# Patient Record
Sex: Male | Born: 1960
Health system: Southern US, Community
[De-identification: ages and names within clinical notes are randomized; demographics above are authoritative.]

## PROBLEM LIST (undated history)

## (undated) DIAGNOSIS — F411 Generalized anxiety disorder: Secondary | ICD-10-CM

## (undated) DIAGNOSIS — L738 Other specified follicular disorders: Secondary | ICD-10-CM

## (undated) HISTORY — DX: Generalized anxiety disorder: F41.1

## (undated) HISTORY — DX: Other specified follicular disorders: L73.8

---

## 2001-10-11 ENCOUNTER — Encounter: Payer: Self-pay | Admitting: Family Medicine

## 2001-10-11 ENCOUNTER — Encounter: Admission: RE | Admit: 2001-10-11 | Discharge: 2001-10-11 | Payer: Self-pay | Admitting: Family Medicine

## 2009-04-15 ENCOUNTER — Encounter: Payer: Self-pay | Admitting: Family Medicine

## 2010-10-12 ENCOUNTER — Ambulatory Visit: Payer: Self-pay | Admitting: Family Medicine

## 2010-10-12 DIAGNOSIS — L738 Other specified follicular disorders: Secondary | ICD-10-CM | POA: Insufficient documentation

## 2010-10-12 DIAGNOSIS — L678 Other hair color and hair shaft abnormalities: Secondary | ICD-10-CM

## 2010-10-12 DIAGNOSIS — F411 Generalized anxiety disorder: Secondary | ICD-10-CM | POA: Insufficient documentation

## 2010-10-12 HISTORY — DX: Generalized anxiety disorder: F41.1

## 2010-10-12 HISTORY — DX: Other specified follicular disorders: L73.8

## 2010-10-12 HISTORY — DX: Other hair color and hair shaft abnormalities: L67.8

## 2011-01-24 NOTE — Assessment & Plan Note (Signed)
Summary: to be est/njr   Vital Signs:  Patient profile:   50 year old male Height:      70.25 inches Weight:      197 pounds BMI:     28.17 Temp:     97.9 degrees F oral Pulse rate:   72 / minute Pulse rhythm:   regular Resp:     12 per minute BP sitting:   122 / 80  (left arm) Cuff size:   regular  Vitals Entered By: Sid Falcon LPN (October 12, 2010 11:27 AM)  Nutrition Counseling: Patient's BMI is greater than 25 and therefore counseled on weight management options.   History of Present Illness: Patient seen to establish care.  He has history of chronic nonspecific anxiety and takes low-dose Lexapro 20 mg one half tablet daily and has been on this for several years. No history of depression. No prior surgeries. No known drug allergies.  He describes recurrent follicular rash mostly trunk and buttock area as well as posterior arms and got some sort of topical steroid per dermatologist which did not help. Rash comes and goes. No clear exacerbating features.  Family history unrevealing.  Social history is that he works as a Designer, industrial/product. Smokes less than one packs per day. No alcohol use.  Preventive Screening-Counseling & Management  Alcohol-Tobacco     Smoking Status: current     Packs/Day: 0.75     Year Started: 1988  Caffeine-Diet-Exercise     Does Patient Exercise: yes  Allergies (verified): No Known Drug Allergies  Past History:  Family History: Last updated: 10/12/2010 Family History of Alcoholism/Addiction, grandparent  Social History: Last updated: 10/12/2010 Occupation:  Development worker, community Married Current Smoker Alcohol use-yes Regular exercise-yes  Risk Factors: Exercise: yes (10/12/2010)  Risk Factors: Smoking Status: current (10/12/2010) Packs/Day: 0.75 (10/12/2010)  Past Medical History: Anxiety Recurrent folliculitis PMH-FH-SH reviewed for relevance  Family History: Family History of Alcoholism/Addiction,  grandparent  Social History: Occupation:  Development worker, community Married Current Smoker Alcohol use-yes Regular exercise-yes Smoking Status:  current Packs/Day:  0.75 Occupation:  employed Does Patient Exercise:  yes  Review of Systems  The patient denies anorexia, fever, weight loss, weight gain, vision loss, decreased hearing, hoarseness, chest pain, syncope, dyspnea on exertion, peripheral edema, prolonged cough, headaches, hemoptysis, abdominal pain, melena, hematochezia, severe indigestion/heartburn, hematuria, incontinence, genital sores, muscle weakness, suspicious skin lesions, transient blindness, difficulty walking, depression, unusual weight change, abnormal bleeding, enlarged lymph nodes, and testicular masses.    Physical Exam  General:  Well-developed,well-nourished,in no acute distress; alert,appropriate and cooperative throughout examination Head:  Normocephalic and atraumatic without obvious abnormalities. No apparent alopecia or balding. Ears:  External ear exam shows no significant lesions or deformities.  Otoscopic examination reveals clear canals, tympanic membranes are intact bilaterally without bulging, retraction, inflammation or discharge. Hearing is grossly normal bilaterally. Mouth:  Oral mucosa and oropharynx without lesions or exudates.  Teeth in good repair. Neck:  No deformities, masses, or tenderness noted. Lungs:  Normal respiratory effort, chest expands symmetrically. Lungs are clear to auscultation, no crackles or wheezes. Heart:  Normal rate and regular rhythm. S1 and S2 normal without gallop, murmur, click, rub or other extra sounds. Skin:  patient has nonspecific rash mostly lower trunk area lower back with some nonspecific erythematous papules on couple of whitish to yellowish center. Psych:  normally interactive, good eye contact, not anxious appearing, and not depressed appearing.     Impression & Recommendations:  Problem # 1:  ANXIETY STATE,  UNSPECIFIED (ICD-300.00)  His updated medication list for this problem includes:    Lexapro 20 Mg Tabs (Escitalopram oxalate) ..... Once daily  Problem # 2:  FOLLICULITIS (ICD-704.8) trial of doxycycline 100 mg by mouth two times a day for 10 days  Complete Medication List: 1)  Lexapro 20 Mg Tabs (Escitalopram oxalate) .... Once daily 2)  Doxycycline Hyclate 100 Mg Caps (Doxycycline hyclate) .... One by mouth two times a day for 10 days  Other Orders: Admin 1st Vaccine (69678) Flu Vaccine 75yrs + (93810)  Patient Instructions: 1)  Consider a complete physical exam at some point within the next year Prescriptions: DOXYCYCLINE HYCLATE 100 MG CAPS (DOXYCYCLINE HYCLATE) one by mouth two times a day for 10 days  #20 x 1   Entered and Authorized by:   Evelena Peat MD   Signed by:   Evelena Peat MD on 10/12/2010   Method used:   Electronically to        [id number] (retail)       [address]       [place], [place]  [address]       Ph: [phone] or [phone]       Fax: [phone]   RxID:   [id number]    Orders Added: 1)  Admin 1st Vaccine [90471] 2)  Flu Vaccine 62yrs + [33825] 3)  New Patient Level III [05397]   Flu Vaccine Consent Questions     Do you have a history of severe allergic reactions to this vaccine? no    Any prior history of allergic reactions to egg and/or gelatin? no    Do you have a sensitivity to the preservative Thimersol? no    Do you have a past history of Guillan-Barre Syndrome? no    Do you currently have an acute febrile illness? no    Have you ever had a severe reaction to latex? no    Vaccine information given and explained to patient? yes    Are you currently pregnant? no    Lot Number:[id number]   Exp Date:06/24/2011   Site Given  Left Deltoid IM         .lbflu

## 2011-01-26 NOTE — Letter (Signed)
Summary: MD [facility] Annual Physical-Dr. Bradd Canary  MD [facility] Annual Physical-Dr. Bradd Canary   Imported By: Maryln Gottron 12/08/2010 14:58:47  _____________________________________________________________________  External Attachment:    Type:   Image     Comment:   External Document

## 2011-03-03 ENCOUNTER — Telehealth: Payer: Self-pay | Admitting: Family Medicine

## 2011-03-03 DIAGNOSIS — F329 Major depressive disorder, single episode, unspecified: Secondary | ICD-10-CM

## 2011-03-03 DIAGNOSIS — F32A Depression, unspecified: Secondary | ICD-10-CM

## 2011-03-03 MED ORDER — ESCITALOPRAM OXALATE 20 MG PO TABS: 20.0000 mg | ORAL_TABLET | Freq: Every day | ORAL | Status: DC

## 2011-03-03 NOTE — Telephone Encounter (Signed)
Pt needs a refill on med: Lexapro 20mg ... [facility]

## 2011-03-03 NOTE — Telephone Encounter (Signed)
Rx sent to pt pharmacy 

## 2011-03-15 ENCOUNTER — Other Ambulatory Visit (INDEPENDENT_AMBULATORY_CARE_PROVIDER_SITE_OTHER): Payer: BC Managed Care – PPO | Admitting: Family Medicine

## 2011-03-15 DIAGNOSIS — E785 Hyperlipidemia, unspecified: Secondary | ICD-10-CM

## 2011-03-15 DIAGNOSIS — Z0000 Encounter for general adult medical examination without abnormal findings: Secondary | ICD-10-CM

## 2011-03-15 LAB — POCT URINALYSIS DIPSTICK
Bilirubin, UA: NEGATIVE
Glucose, UA: NEGATIVE
Ketones, UA: NEGATIVE
Leukocytes, UA: NEGATIVE
Nitrite, UA: NEGATIVE
Protein, UA: NEGATIVE
Spec Grav, UA: 1.025
Urobilinogen, UA: 0.2
pH, UA: 5

## 2011-03-15 LAB — CBC WITH DIFFERENTIAL/PLATELET
Basophils Absolute: 0 10*3/uL (ref 0.0–0.1)
Basophils Relative: 0.3 % (ref 0.0–3.0)
Eosinophils Absolute: 0.3 10*3/uL (ref 0.0–0.7)
Eosinophils Relative: 3.8 % (ref 0.0–5.0)
HCT: 42.4 % (ref 39.0–52.0)
Hemoglobin: 14.4 g/dL (ref 13.0–17.0)
Lymphocytes Relative: 29 % (ref 12.0–46.0)
Lymphs Abs: 2.5 10*3/uL (ref 0.7–4.0)
MCHC: 34 g/dL (ref 30.0–36.0)
MCV: 91.7 fl (ref 78.0–100.0)
Monocytes Absolute: 0.6 10*3/uL (ref 0.1–1.0)
Monocytes Relative: 7.3 % (ref 3.0–12.0)
Neutro Abs: 5.1 10*3/uL (ref 1.4–7.7)
Neutrophils Relative %: 59.6 % (ref 43.0–77.0)
Platelets: 223 10*3/uL (ref 150.0–400.0)
RBC: 4.63 Mil/uL (ref 4.22–5.81)
RDW: 12.6 % (ref 11.5–14.6)
WBC: 8.6 10*3/uL (ref 4.5–10.5)

## 2011-03-15 LAB — BASIC METABOLIC PANEL
BUN: 20 mg/dL (ref 6–23)
CO2: 28 mEq/L (ref 19–32)
Calcium: 8.9 mg/dL (ref 8.4–10.5)
Chloride: 103 mEq/L (ref 96–112)
Creatinine, Ser: 0.9 mg/dL (ref 0.4–1.5)
GFR: 94.46 mL/min (ref >60.00)
Glucose, Bld: 90 mg/dL (ref 70–99)
Potassium: 3.9 mEq/L (ref 3.5–5.1)
Sodium: 137 mEq/L (ref 135–145)

## 2011-03-15 LAB — HEPATIC FUNCTION PANEL
ALT: 21 U/L (ref 0–53)
AST: 22 U/L (ref 0–37)
Albumin: 4.4 g/dL (ref 3.5–5.2)
Alkaline Phosphatase: 49 U/L (ref 39–117)
Bilirubin, Direct: 0.1 mg/dL (ref 0.0–0.3)
Total Bilirubin: 0.4 mg/dL (ref 0.3–1.2)
Total Protein: 6.3 g/dL (ref 6.0–8.3)

## 2011-03-15 LAB — LIPID PANEL
Cholesterol: 138 mg/dL (ref 0–200)
HDL: 24.2 mg/dL — ABNORMAL LOW (ref >39.00)
Total CHOL/HDL Ratio: 6
Triglycerides: 250 mg/dL — ABNORMAL HIGH (ref 0.0–149.0)
VLDL: 50 mg/dL — ABNORMAL HIGH (ref 0.0–40.0)

## 2011-03-15 LAB — LDL CHOLESTEROL, DIRECT: Direct LDL: 65.6 mg/dL

## 2011-03-15 LAB — TSH: TSH: 1.83 u[IU]/mL (ref 0.35–5.50)

## 2011-03-21 ENCOUNTER — Encounter: Payer: Self-pay | Admitting: Family Medicine

## 2011-03-22 ENCOUNTER — Encounter: Payer: Self-pay | Admitting: Family Medicine

## 2011-03-22 ENCOUNTER — Ambulatory Visit (INDEPENDENT_AMBULATORY_CARE_PROVIDER_SITE_OTHER): Payer: BC Managed Care – PPO | Admitting: Family Medicine

## 2011-03-22 DIAGNOSIS — R319 Hematuria, unspecified: Secondary | ICD-10-CM

## 2011-03-22 LAB — POCT URINALYSIS DIPSTICK
Protein, UA: NEGATIVE
Spec Grav, UA: 1.01
Urobilinogen, UA: 0.2

## 2011-03-22 NOTE — Patient Instructions (Signed)
Continue regular exercise. Continue efforts to quit smoking. Consider omega-3 supplement 2-3 g per day

## 2011-03-22 NOTE — Progress Notes (Signed)
  Subjective:    Patient ID: [name] [name], male    DOB: May 30, 1961, 50 y.o.   MRN: [id number]  HPI Patient here for complete physical. He is exercising consistently. Trying to quit smoking. Last tetanus date not confirmed at this time-but he thinks less than 5 years ago. He takes Lexapro for some chronic anxiety but no other medications. Past medical history, social history, and family history reviewed. No family history of cancer, hypertension, or CAD.  Sister with type 2 diabetes   Review of Systems  Constitutional: Negative for fever, activity change, appetite change and fatigue.  HENT: Negative for ear pain, congestion and trouble swallowing.   Eyes: Negative for pain and visual disturbance.  Respiratory: Negative for cough, shortness of breath and wheezing.   Cardiovascular: Negative for chest pain and palpitations.  Gastrointestinal: Negative for nausea, vomiting, abdominal pain, diarrhea, constipation, blood in stool, abdominal distention and rectal pain.  Genitourinary: Negative for dysuria, hematuria and testicular pain.  Musculoskeletal: Negative for joint swelling and arthralgias.  Skin: Positive for rash (continued follicular rash off and on buttocks.).  Neurological: Negative for dizziness, syncope and headaches.  Hematological: Negative for adenopathy.  Psychiatric/Behavioral: Negative for confusion and dysphoric mood.       Objective:   Physical Exam  Constitutional: He is oriented to person, place, and time. He appears well-developed and well-nourished. No distress.  HENT:  Head: Normocephalic and atraumatic.  Right Ear: External ear normal.  Left Ear: External ear normal.  Mouth/Throat: Oropharynx is clear and moist.  Eyes: Conjunctivae and EOM are normal. Pupils are equal, round, and reactive to light.  Neck: Normal range of motion. Neck supple. No thyromegaly present.  Cardiovascular: Normal rate, regular rhythm and normal heart sounds.   No murmur  heard. Pulmonary/Chest: No respiratory distress. He has no wheezes. He has no rales.  Abdominal: Soft. Bowel sounds are normal. He exhibits no distension and no mass. There is no tenderness. There is no rebound and no guarding.  Genitourinary: Rectum normal and prostate normal.  Musculoskeletal: He exhibits no edema.  Lymphadenopathy:    He has no cervical adenopathy.  Neurological: He is alert and oriented to person, place, and time. He displays normal reflexes. No cranial nerve deficit.  Skin: No rash noted.  Psychiatric: He has a normal mood and affect.          Assessment & Plan:  #1 health maintenance issues addressed. Discussed smoking cessation. Confirm date of last tetanus. Continue regular exercise. Labs reviewed and significant for low HDL high triglyceride. Consider omega-3 supplement. #2 hematuria noted on urine dipstick. Repeat urine today and if still present needs further evaluation #3 dyslipidemia. Has high triglycerides and low HDL

## 2011-09-06 ENCOUNTER — Other Ambulatory Visit: Payer: Self-pay | Admitting: *Deleted

## 2011-09-06 MED ORDER — ESCITALOPRAM OXALATE 20 MG PO TABS: ORAL_TABLET | ORAL | Status: DC

## 2012-04-16 ENCOUNTER — Other Ambulatory Visit: Payer: Self-pay | Admitting: Family Medicine

## 2012-09-13 ENCOUNTER — Encounter: Payer: Self-pay | Admitting: Family Medicine

## 2012-09-13 ENCOUNTER — Ambulatory Visit (INDEPENDENT_AMBULATORY_CARE_PROVIDER_SITE_OTHER): Payer: BC Managed Care – PPO | Admitting: Family Medicine

## 2012-09-13 VITALS — BP 118/70 | Temp 97.8°F | Wt 177.0 lb

## 2012-09-13 DIAGNOSIS — R41840 Attention and concentration deficit: Secondary | ICD-10-CM

## 2012-09-13 MED ORDER — ESCITALOPRAM OXALATE 20 MG PO TABS: 20.0000 mg | ORAL_TABLET | Freq: Every day | ORAL | Status: DC

## 2012-09-13 NOTE — Patient Instructions (Addendum)
Increase Lexapro to 20 mg daily and be in touch in 2-3 weeks if no further improvement.

## 2012-09-13 NOTE — Progress Notes (Signed)
  Subjective:    Patient ID: [name] [name], male    DOB: 1961/10/21, 51 y.o.   MRN: [id number]  HPI  Patient is seen with concerns over difficulty focusing. 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 Recently had difficulty staying focused and completing tasks. No history of ADD diagnosis. Recently took online  ADD screen which showed moderate ADD likely. Recent has been more prone to careless mistakes. Denies any depressive symptoms. Long history of anxiety treated with Lexapro currently 10 mg daily. Has somewhat more anxious recently. No fatigue issues.. Job is very stressful but no new stressors. His difficulty focusing seems to occur in more than one environment.  He recalls having difficulty focusing in school from early age but did fairly well with grades.   Review of Systems  Constitutional: Negative for appetite change and unexpected weight change.  Psychiatric/Behavioral: Negative for dysphoric mood and agitation. The patient is nervous/anxious.        Objective:   Physical Exam  Constitutional: He is oriented to person, place, and time. He appears well-developed and well-nourished.  Cardiovascular: Normal rate and regular rhythm.   Pulmonary/Chest: Effort normal and breath sounds normal. No respiratory distress. He has no wheezes. He has no rales.  Musculoskeletal: He exhibits no edema.  Neurological: He is alert and oriented to person, place, and time. No cranial nerve deficit.  Psychiatric: He has a normal mood and affect. His behavior is normal.          Assessment & Plan:  Difficulty focusing. Question of anxiety related versus possible ADD. Trial of increasing Lexapro to 20 mg daily. Touch base 2-3 weeks. May consider trial of stimulant medication if not improving

## 2013-04-30 ENCOUNTER — Telehealth: Payer: Self-pay | Admitting: Family Medicine

## 2013-04-30 NOTE — Telephone Encounter (Signed)
Patient Information:  Caller Name: Eliazar  Phone: [phone]  Patient: [name], [name]  Gender: Male  DOB: 12-10-1961  Age: 52 Years  PCP: Evelena Peat (Family Practice)  Office Follow Up:  Does the office need to follow up with this patient?: No  Instructions For The Office: N/A  RN Note:  informed Chang that an OV is usually needed to get an antibiotic; advised using an UC since Noelle is out of state and can't come in the office to be seen  Symptoms  Reason For Call & Symptoms: says sxs started with a head cold and seems to have moved into his chest; achy; coughing up greenish phlegm; feels like he has a fever; is in [place] on business at this time; congested  Reviewed Health History In EMR: Yes  Reviewed Medications In EMR: Yes  Reviewed Allergies In EMR: Yes  Reviewed Surgeries / Procedures: Yes  Date of Onset of Symptoms: 04/27/2013  Treatments Tried: Alka Seltzer daytime cold  Treatments Tried Worked: Yes  Guideline(s) Used:  Cough  Colds  Disposition Per Guideline:   Go to Office Now  Reason For Disposition Reached:   Wheezing is present  Advice Given:  N/A  RN Overrode Recommendation:  Go To [facility]  pt is in [place]

## 2013-10-22 ENCOUNTER — Other Ambulatory Visit: Payer: Self-pay | Admitting: Family Medicine

## 2014-01-07 ENCOUNTER — Telehealth: Payer: Self-pay | Admitting: Family Medicine

## 2014-01-07 MED ORDER — ESCITALOPRAM OXALATE 20 MG PO TABS: ORAL_TABLET | ORAL | Status: DC

## 2014-01-07 NOTE — Telephone Encounter (Signed)
Pt request refill of escitalopram (LEXAPRO) 20 MG tablet 90 day [facility] [address]

## 2014-01-07 NOTE — Telephone Encounter (Signed)
RX sent to pharmacy. Pt needs office visit

## 2014-02-16 ENCOUNTER — Encounter: Payer: Self-pay | Admitting: Family Medicine

## 2014-02-16 ENCOUNTER — Ambulatory Visit (INDEPENDENT_AMBULATORY_CARE_PROVIDER_SITE_OTHER): Payer: BC Managed Care – PPO | Admitting: Family Medicine

## 2014-02-16 VITALS — BP 120/76 | HR 70 | Temp 98.2°F | Wt 196.0 lb

## 2014-02-16 DIAGNOSIS — L03211 Cellulitis of face: Secondary | ICD-10-CM

## 2014-02-16 DIAGNOSIS — L0201 Cutaneous abscess of face: Secondary | ICD-10-CM

## 2014-02-16 MED ORDER — DOXYCYCLINE HYCLATE 100 MG PO TABS: 100.0000 mg | ORAL_TABLET | Freq: Two times a day (BID) | ORAL | Status: DC

## 2014-02-16 NOTE — Progress Notes (Signed)
Pre visit review using our clinic review tool, if applicable. No additional management support is needed unless otherwise documented below in the visit note. 

## 2014-02-16 NOTE — Patient Instructions (Signed)
Use warm compresses several times daily Be in touch for any fever or progressive facial edema

## 2014-02-16 NOTE — Progress Notes (Signed)
   Subjective:    Patient ID: [name] [name], male    DOB: 11/08/1961, 53 y.o.   MRN: [id number]  HPI Right facial pain and swelling for the past few days. He just returned from [place]. He thought there may have been some sort of bite. Erythematous type pimple which has  progressively swollen. No pustular center. No known history of MRSA. No fever or chills. He tried topical Neosporin without improvement  Past Medical History  Diagnosis Date  . Anxiety state, unspecified 10/12/2010  . FOLLICULITIS 10/12/2010   No past surgical history on file.  reports that he has been smoking Cigarettes.  He has a 7.5 pack-year smoking history. He does not have any smokeless tobacco history on file. His alcohol and drug histories are not on file. family history includes Alcohol abuse in his maternal grandfather; Diabetes in his sister. No Known Allergies    Review of Systems  Constitutional: Negative for fever and chills.       Objective:   Physical Exam  Constitutional: He appears well-developed and well-nourished.  Cardiovascular: Normal rate and regular rhythm.   Pulmonary/Chest: Effort normal and breath sounds normal. No respiratory distress. He has no wheezes. He has no rales.  Skin:  Patient has small area of induration approximately one 1 half centimeter right cheek region. He has surrounding area of erythema which is about 1-1/2 cm diameter. No pustules. No fluctuance. Minimally tender.          Assessment & Plan:  Early cellulitis right face. Start doxycycline 100 mg twice daily for 10 days. Warm compresses several times daily.  Followup promptly for fever or worsening edema

## 2014-02-17 ENCOUNTER — Telehealth: Payer: Self-pay | Admitting: Family Medicine

## 2014-02-17 NOTE — Telephone Encounter (Signed)
Relevant patient education mailed to patient.  

## 2014-03-31 ENCOUNTER — Telehealth: Payer: Self-pay | Admitting: Family Medicine

## 2014-03-31 MED ORDER — ESCITALOPRAM OXALATE 20 MG PO TABS: ORAL_TABLET | ORAL | Status: DC

## 2014-03-31 NOTE — Telephone Encounter (Signed)
Pt request 90 day escitalopram (LEXAPRO) 20 MG tablet Pt will be traveling out of town for business and will be gone until 05/02/14/  [facility] [address]

## 2014-03-31 NOTE — Telephone Encounter (Signed)
Rx sent to pharmacy   

## 2014-06-26 ENCOUNTER — Other Ambulatory Visit: Payer: Self-pay | Admitting: Family Medicine

## 2014-08-19 ENCOUNTER — Encounter: Payer: Self-pay | Admitting: Family Medicine

## 2014-08-19 ENCOUNTER — Ambulatory Visit (INDEPENDENT_AMBULATORY_CARE_PROVIDER_SITE_OTHER): Payer: BC Managed Care – PPO | Admitting: Family Medicine

## 2014-08-19 VITALS — BP 119/79 | HR 60 | Temp 97.4°F | Wt 196.0 lb

## 2014-08-19 DIAGNOSIS — M25551 Pain in right hip: Secondary | ICD-10-CM

## 2014-08-19 DIAGNOSIS — M25559 Pain in unspecified hip: Secondary | ICD-10-CM

## 2014-08-19 NOTE — Progress Notes (Signed)
Pre visit review using our clinic review tool, if applicable. No additional management support is needed unless otherwise documented below in the visit note. 
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
## 2014-08-21 ENCOUNTER — Ambulatory Visit: Payer: BC Managed Care – PPO | Admitting: Family Medicine

## 2014-08-27 ENCOUNTER — Encounter: Payer: Self-pay | Admitting: Family Medicine

## 2014-08-27 ENCOUNTER — Ambulatory Visit (INDEPENDENT_AMBULATORY_CARE_PROVIDER_SITE_OTHER): Payer: BC Managed Care – PPO | Admitting: Family Medicine

## 2014-08-27 ENCOUNTER — Other Ambulatory Visit (INDEPENDENT_AMBULATORY_CARE_PROVIDER_SITE_OTHER): Payer: BC Managed Care – PPO

## 2014-08-27 VITALS — BP 112/74 | HR 58 | Ht 72.0 in | Wt 196.0 lb

## 2014-08-27 DIAGNOSIS — M7061 Trochanteric bursitis, right hip: Secondary | ICD-10-CM | POA: Insufficient documentation

## 2014-08-27 DIAGNOSIS — M25551 Pain in right hip: Secondary | ICD-10-CM

## 2014-08-27 DIAGNOSIS — M76899 Other specified enthesopathies of unspecified lower limb, excluding foot: Secondary | ICD-10-CM

## 2014-08-27 DIAGNOSIS — M25559 Pain in unspecified hip: Secondary | ICD-10-CM
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
## 2014-08-27 NOTE — Assessment & Plan Note (Signed)
Patient did have a gluteal muscle tendon injury as well as trochanter bursitis. Patient did respond fairly well to injection and was given hip abductor exercises that are going to be beneficial. We discussed range of motion exercises as well as icing protocol. Patient is going to time and was given a prescription for topical anti-inflammatories. 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 Patient will try this and come back and see me again in 3-4 weeks or when he is back in town for further evaluation and treatment.

## 2014-08-27 NOTE — Patient Instructions (Signed)
Good to see you Ice 20 minutes 2 times daily. Usually after activity and before bed. Exercises 3 times a week.  Exercises on wall.  Heel and butt touching.  Raise leg 6 inches and hold 2 seconds.  Down slow for count of 4 seconds.  1 set of 30 reps daily on both sides.  Continue the naproxen.  Consider talking to your chiropractor about the SI joint.  Capsacin topically to the back  Come back again in 3-4 weeks. When you can.

## 2014-10-09 ENCOUNTER — Other Ambulatory Visit: Payer: Self-pay

## 2014-12-04 ENCOUNTER — Other Ambulatory Visit (INDEPENDENT_AMBULATORY_CARE_PROVIDER_SITE_OTHER): Payer: BC Managed Care – PPO

## 2014-12-04 ENCOUNTER — Ambulatory Visit (INDEPENDENT_AMBULATORY_CARE_PROVIDER_SITE_OTHER)
Admission: RE | Admit: 2014-12-04 | Discharge: 2014-12-04 | Disposition: A | Discharge status: Home / Self Care | Payer: BC Managed Care – PPO | Source: Ambulatory Visit | Attending: Family Medicine | Admitting: Family Medicine

## 2014-12-04 ENCOUNTER — Ambulatory Visit (INDEPENDENT_AMBULATORY_CARE_PROVIDER_SITE_OTHER): Payer: BC Managed Care – PPO | Admitting: Family Medicine

## 2014-12-04 ENCOUNTER — Encounter: Payer: Self-pay | Admitting: Family Medicine

## 2014-12-04 VITALS — BP 92/64 | HR 72 | Ht 72.0 in | Wt 199.0 lb

## 2014-12-04 DIAGNOSIS — M25551 Pain in right hip: Secondary | ICD-10-CM

## 2014-12-04 DIAGNOSIS — M7061 Trochanteric bursitis, right hip: Secondary | ICD-10-CM
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
## 2014-12-04 NOTE — Patient Instructions (Addendum)
Good to see you.   Happy holidays! Exercises 3 times a week.  We injected your hip again.  Wear good shoes and focus on the exercises Exercises on wall.  Heel and butt touching.  Raise leg 6 inches and hold 2 seconds.  Down slow for count of 4 seconds.  1 set of 30 reps daily on both sides.  Vitamin D 2000 IU daily See me again 4 weeks.
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
## 2015-01-03 ENCOUNTER — Other Ambulatory Visit: Payer: Self-pay | Admitting: Family Medicine

## 2015-02-08 ENCOUNTER — Other Ambulatory Visit (INDEPENDENT_AMBULATORY_CARE_PROVIDER_SITE_OTHER): Payer: BLUE CROSS/BLUE SHIELD

## 2015-02-08 ENCOUNTER — Ambulatory Visit (INDEPENDENT_AMBULATORY_CARE_PROVIDER_SITE_OTHER): Payer: BLUE CROSS/BLUE SHIELD | Admitting: Family Medicine

## 2015-02-08 ENCOUNTER — Encounter: Payer: Self-pay | Admitting: Family Medicine

## 2015-02-08 VITALS — BP 108/68 | HR 75 | Ht 72.0 in | Wt 193.0 lb

## 2015-02-08 DIAGNOSIS — M25551 Pain in right hip: Secondary | ICD-10-CM | POA: Diagnosis not present

## 2015-02-08 DIAGNOSIS — M7061 Trochanteric bursitis, right hip: Secondary | ICD-10-CM

## 2015-02-08 NOTE — Assessment & Plan Note (Signed)
Patient is improving overall but slowly. patient is doing more the home exercises and patient was given topical anti-inflammatories. We discussed to avoid significant pressure in this area. Patient will try topical anti-inflammatories, patient was given more progressive home exercises with strengthening. Patient and will come back and see me again in 3-4 weeks for further evaluation and treatment. Patient may be going out of town for greater amount of time. Then patient will come back on an as-needed basis.

## 2015-02-08 NOTE — Patient Instructions (Addendum)
Good to see you again.  You are doing great [place] twice daily.  Continue the exercises

## 2015-02-08 NOTE — Progress Notes (Signed)
[name] [address] [place], [place] [address] Phone: [phone] Subjective:     CC: Lateral hip pain follow-up  BJY:NWGNFAOZHY [name] [name] is a 54 y.o. male coming in with complaint of lateral hip pain. Patient was seen 3 months ago and was given an injection for greater trochanteric bursitis. Patient had this done 2 months ago. Patient states he's been doing significantly better. Patient has been doing a lot more of home exercises. Patient states that it is starting have some mild dull aching throbbing pain again on the lateral aspect of the hip. Patient states it is significantly better than where the pin but patient is going out of town for multiple months again is wondering if another injection is possible. We discussed icing regimen. She has not been doing a regularly. Patient though states as long as he doesn't exercises regularly he does well.   Past medical history, social, surgical and family history all reviewed in electronic medical record.   Review of Systems: No headache, visual changes, nausea, vomiting, diarrhea, constipation, dizziness, abdominal pain, skin rash, fevers, chills, night sweats, weight loss, swollen lymph nodes, body aches, joint swelling, muscle aches, chest pain, shortness of breath, mood changes.   Objective Blood pressure 108/68, pulse 75, height 6' (1.829 m), weight 193 lb (87.544 kg), SpO2 97 %.  General: No apparent distress alert and oriented x3 mood and affect normal, dressed appropriately.  HEENT: Pupils equal, extraocular movements intact  Respiratory: Patient's speak in full sentences and does not appear short of breath  Cardiovascular: No lower extremity edema, non tender, no erythema  Skin: Warm dry intact with no signs of infection or rash on extremities or on axial skeleton.  Abdomen: Soft nontender  Neuro: Cranial nerves II through XII are intact, neurovascularly intact in all extremities with 2+ DTRs and 2+  pulses.  Lymph: No lymphadenopathy of posterior or anterior cervical chain or axillae bilaterally.  Gait normal with good balance and coordination.  MSK:  Non tender with full range of motion and good stability and symmetric strength and tone of shoulders, elbows, wrist,  knee and ankles bilaterally.  Back Exam:  Inspection: Unremarkable  Motion: Flexion 45 deg, Extension 45 deg, Side Bending to 45 deg bilaterally,  Rotation to 45 deg bilaterally  SLR laying: Negative  XSLR laying: Negative  Palpable tenderness: Tender to palpation over the greater trochanteric bursa as well as the gluteal muscle and piriformis muscle FABER: Positive right Sensory change: Gross sensation intact to all lumbar and sacral dermatomes.  Reflexes: 2+ at both patellar tendons, 2+ at achilles tendons, Babinski's downgoing.  Strength at foot  Plantar-flexion: 5/5 Dorsi-flexion: 5/5 Eversion: 5/5 Inversion: 5/5  Leg strength  Quad: 5/5 Hamstring: 5/5 Hip flexor: 5/5 Hip abductors: 4/5 which is worse than previous exam Gait unremarkable. MSK US performed of: Right This study was ordered, performed, and interpreted by Terrilee Files D.O.  Hip: Trochanteric bursa with significant hypoechoic changes and swelling as well as significant hypoechoic changes at the insertion of the gluteal tendon Acetabular labrum visualized and without tears, displacement, or effusion in joint. Femoral neck appears unremarkable without increased power doppler signal along Cortex.  IMPRESSION:  Greater trochanter bursitis   Procedure: Real-time Ultrasound Guided Injection of right greater trochanteric bursitis and insertion of gluteal tendon Device: GE Logiq E  Ultrasound guided injection is preferred based studies that show increased duration, increased effect, greater accuracy, decreased procedural pain, increased response rate, and decreased cost with ultrasound guided  versus blind injection.  Verbal informed consent obtained.    Time-out conducted.  Noted no overlying erythema, induration, or other signs of local infection.  Skin prepped in a sterile fashion.  Local anesthesia: Topical Ethyl chloride.  With sterile technique and under real time ultrasound guidance:  Greater trochanteric area was visualized and patient's bursa was noted. A 22-gauge 3 inch needle was inserted and 4 cc of 0.5% Marcaine and 1 cc of Kenalog 40 mg/dL was injected. Pictures taken Completed without difficulty  Pain immediately resolved suggesting accurate placement of the medication.  Advised to call if fevers/chills, erythema, induration, drainage, or persistent bleeding.  Images permanently stored and available for review in the ultrasound unit.  Impression: Technically successful ultrasound guided injection.        Impression and Recommendations:     This case required medical decision making of moderate complexity.

## 2015-02-08 NOTE — Progress Notes (Signed)
Pre visit review using our clinic review tool, if applicable. No additional management support is needed unless otherwise documented below in the visit note. 

## 2016-07-13 IMAGING — CR DG HIP COMPLETE 2+V*R*
3 series · 3 of 3 positions shown · non-contrast
Comparison: None.

CLINICAL DATA: 53-year-old male with right lateral hip pain for 2
years with no known injury. Initial encounter.

EXAM:
RIGHT HIP - COMPLETE 2+ VIEW

[view not recorded (1 of 3)]
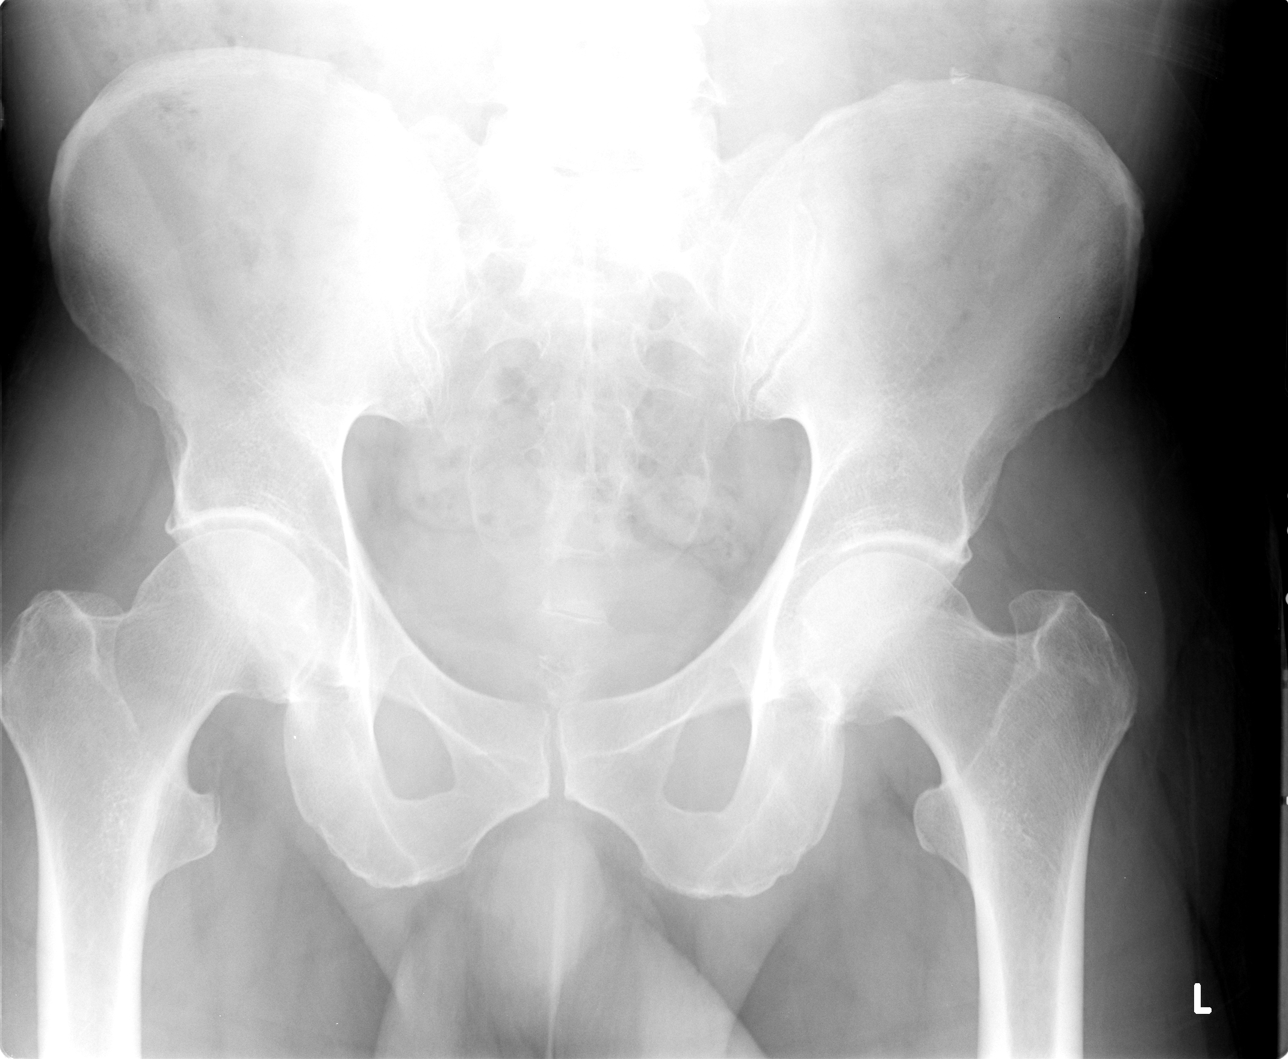

[view not recorded (2 of 3)]
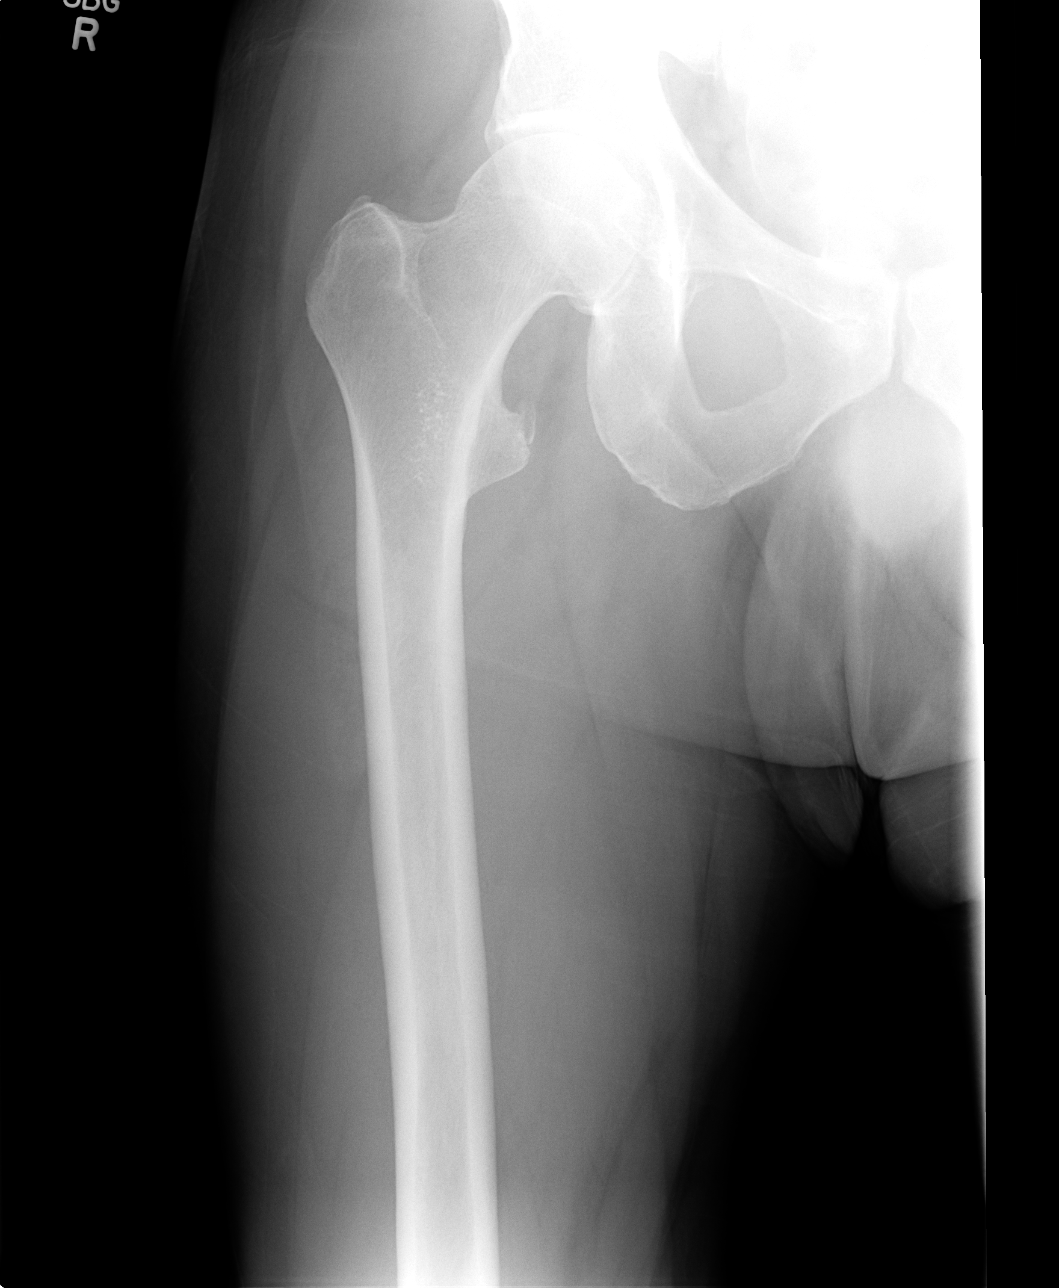

[view not recorded (3 of 3)]
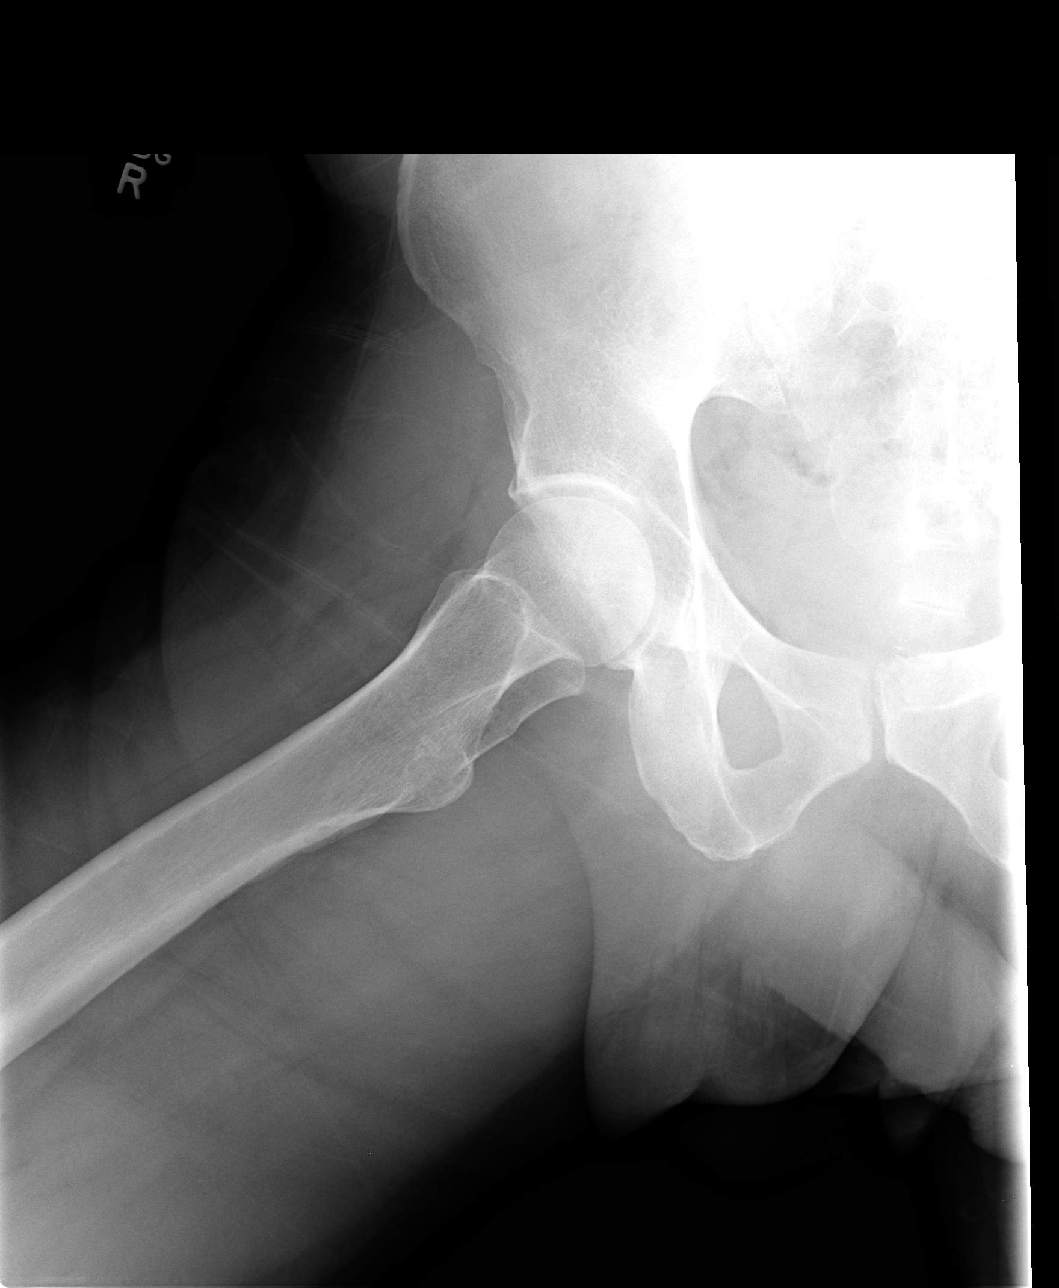

[3 of 3 positions shown; findings below may reference images not displayed]

FINDINGS: Both femoral heads are normally located. Hip joint spaces are
relatively normal. There is mild acetabular subchondral sclerosis.
Pelvis is intact. Sacral ala and SI joints are within normal limits.

Soft tissue calcification adjacent to the lesser trochanter which
most resembles an enthesophyte. No trochanter fracture identified.
Proximal right femur appears intact.
IMPRESSION: 1. No acute osseous abnormality identified at the right hip or
pelvis.
2. Suggestion of degenerative enthesophyte at the lesser trochanter.
No significant hip osteoarthritis.

## 2016-07-13 IMAGING — CR DG LUMBAR SPINE COMPLETE 4+V
5 series · 5 of 5 positions shown · non-contrast
Comparison: 04/07/2011.

CLINICAL DATA: Low back pain.

EXAM:
LUMBAR SPINE - COMPLETE 4+ VIEW

[view not recorded (1 of 5)]
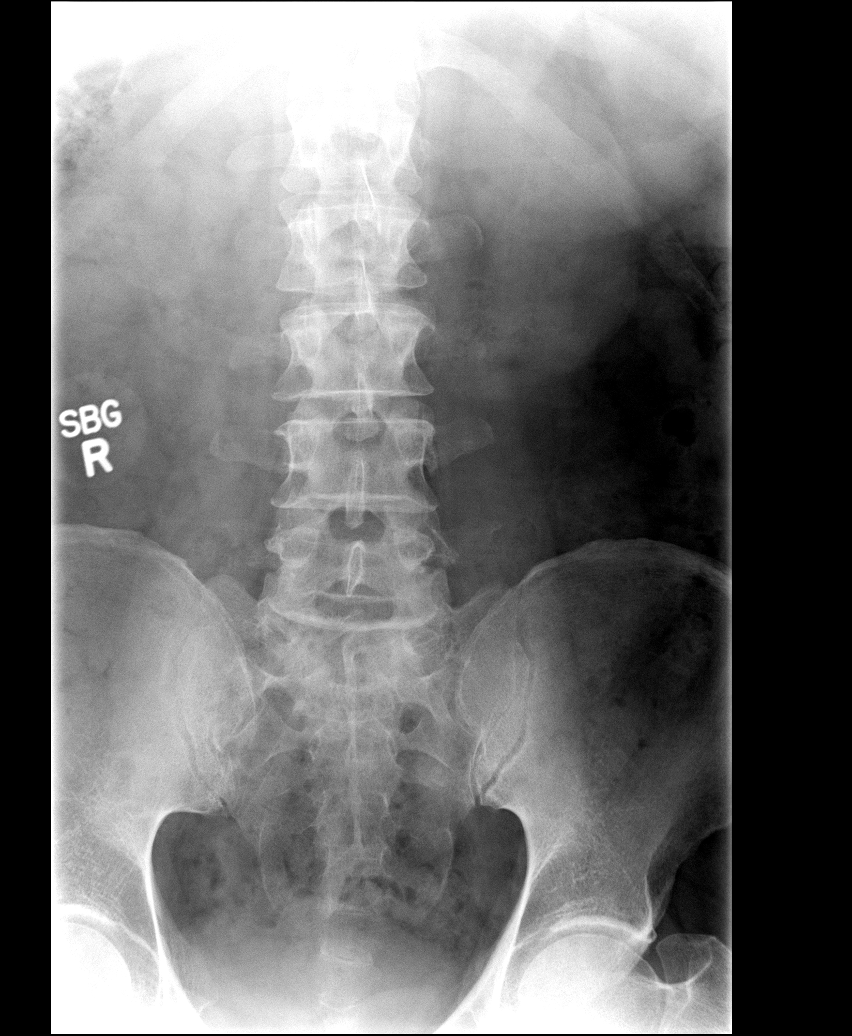

[view not recorded (2 of 5)]
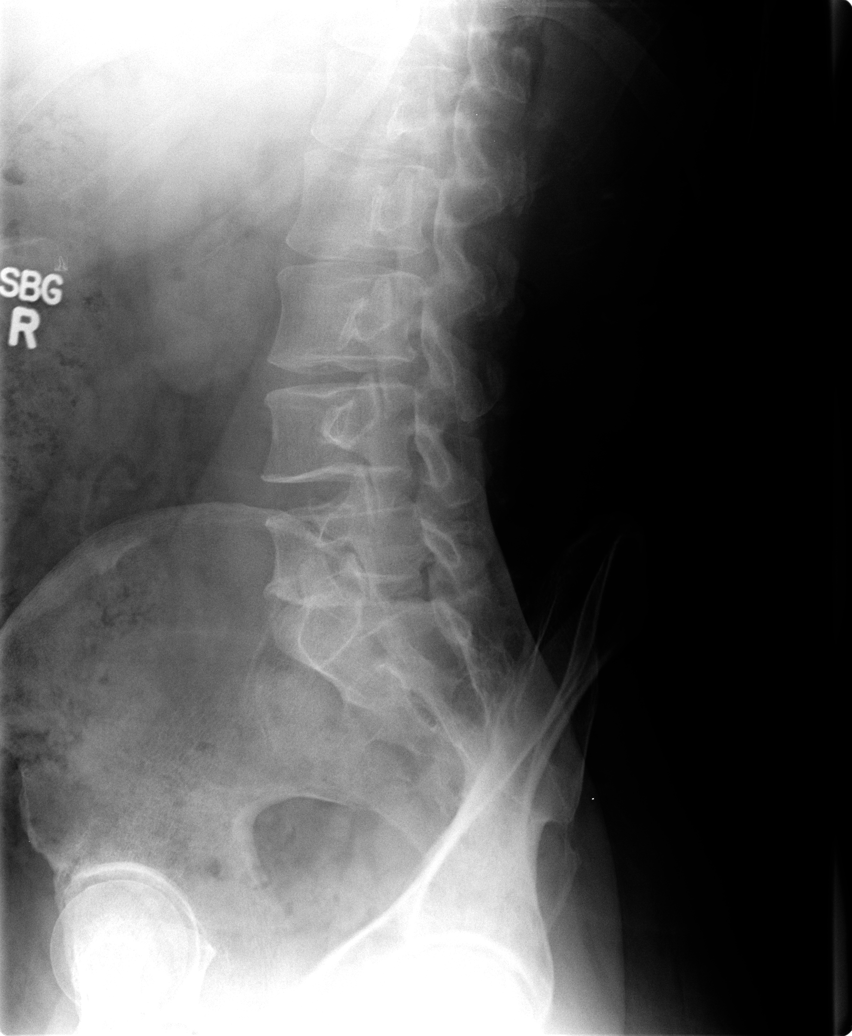

[view not recorded (3 of 5)]
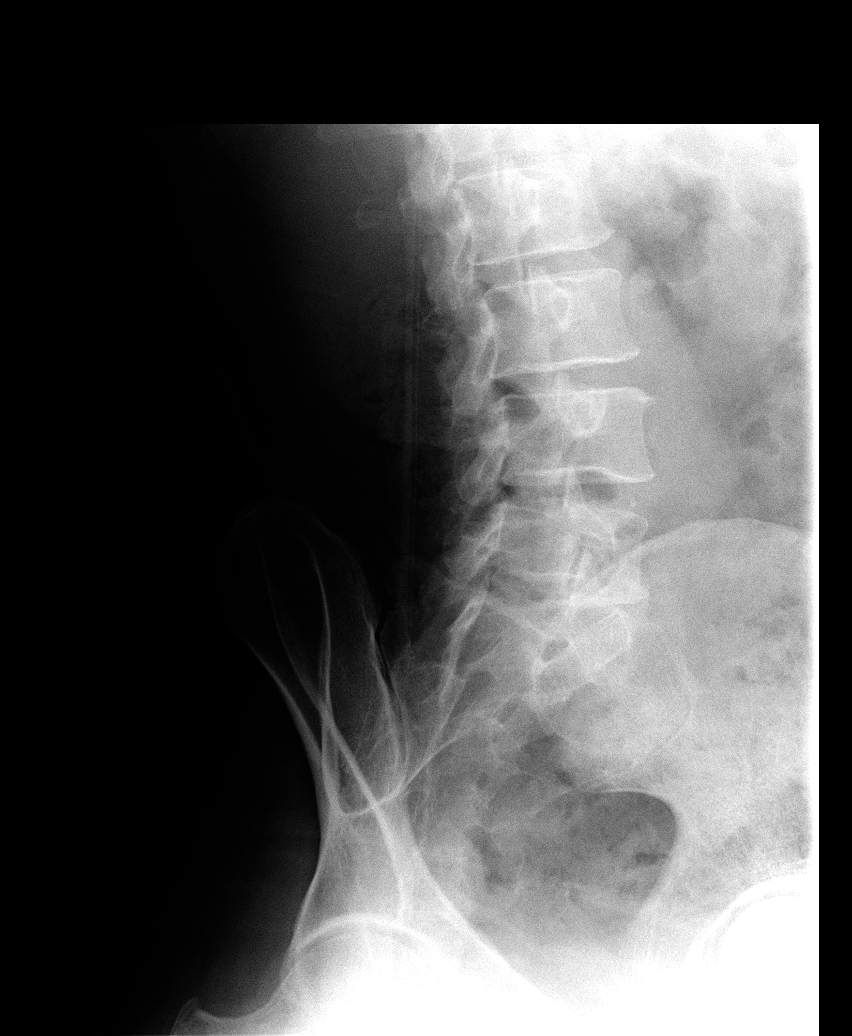

[view not recorded (4 of 5)]
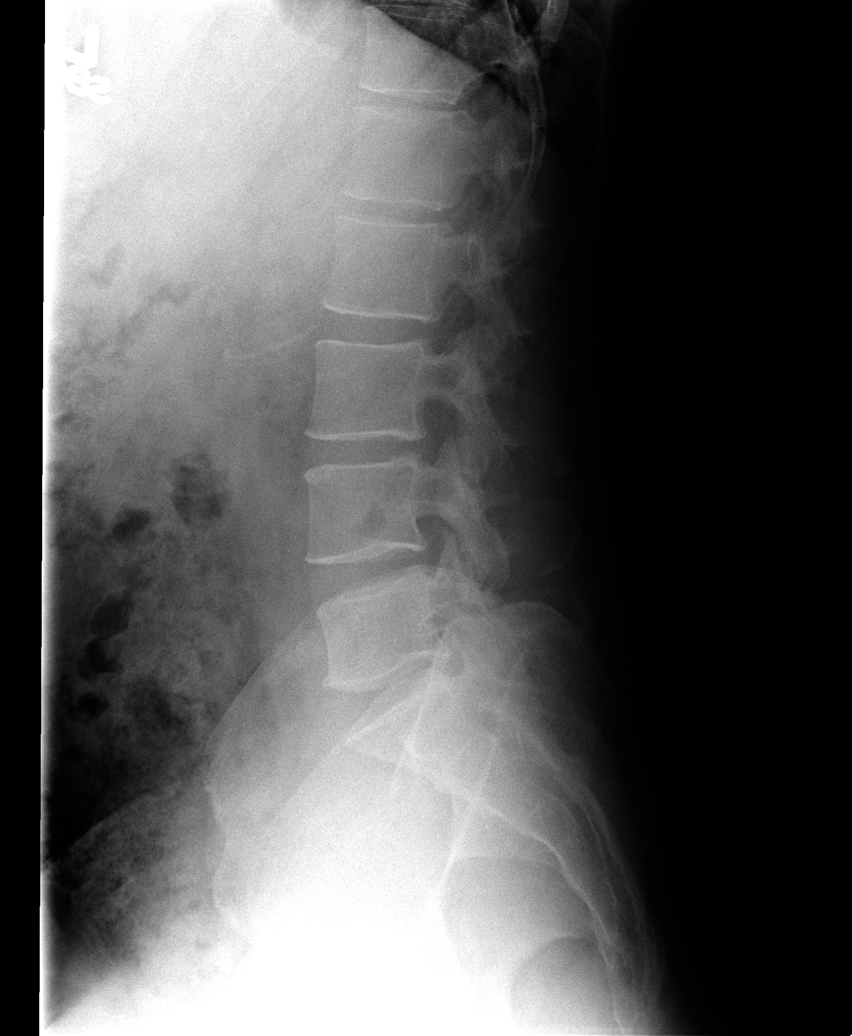

[view not recorded (5 of 5)]
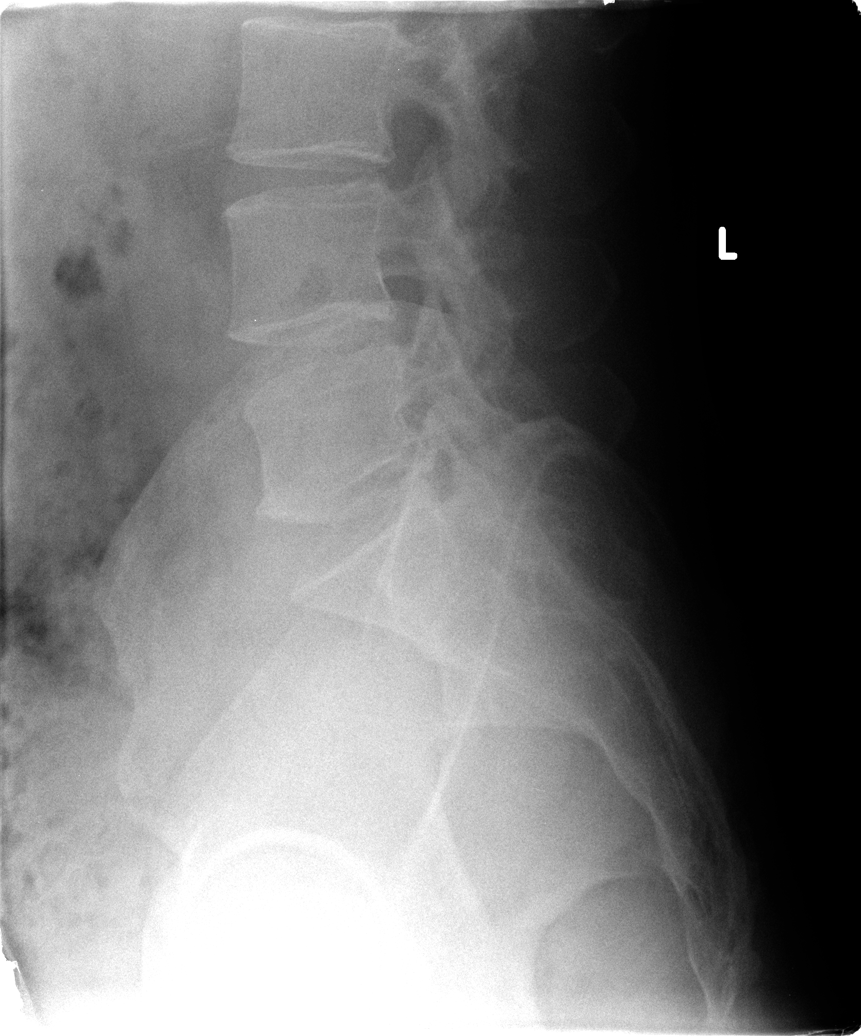

[5 of 5 positions shown; findings below may reference images not displayed]

FINDINGS: Diffuse degenerative change. No acute abnormality identified. No
evidence of fracture or dislocation. Pedicles are intact. Lucency
noted over the L4 vertebral body on the lateral view only is most
likely overlying bowel gas. Normal alignment.
IMPRESSION: Diffuse degenerative change.  No acute bony abnormality identified.

## 2016-08-02 ENCOUNTER — Ambulatory Visit: Payer: BLUE CROSS/BLUE SHIELD | Admitting: Family Medicine

## 2016-08-07 ENCOUNTER — Ambulatory Visit (INDEPENDENT_AMBULATORY_CARE_PROVIDER_SITE_OTHER): Payer: BLUE CROSS/BLUE SHIELD | Admitting: Family Medicine

## 2016-08-07 ENCOUNTER — Encounter: Payer: Self-pay | Admitting: Family Medicine

## 2016-08-07 VITALS — BP 90/60 | HR 73 | Temp 98.0°F | Ht 70.0 in | Wt 184.0 lb

## 2016-08-07 DIAGNOSIS — F411 Generalized anxiety disorder: Secondary | ICD-10-CM | POA: Diagnosis not present

## 2016-08-07 DIAGNOSIS — Z23 Encounter for immunization: Secondary | ICD-10-CM

## 2016-08-07 MED ORDER — ESCITALOPRAM OXALATE 20 MG PO TABS: 20.0000 mg | ORAL_TABLET | Freq: Every day | ORAL | 1 refills | Status: DC

## 2016-08-07 NOTE — Patient Instructions (Signed)
Check on coverage for Cologuard.- 

## 2016-08-07 NOTE — Progress Notes (Signed)
Pre visit review using our clinic review tool, if applicable. No additional management support is needed unless otherwise documented below in the visit note. 

## 2016-08-07 NOTE — Progress Notes (Signed)
Subjective:     Patient ID: [name] [name], male   DOB: 04/07/1961, 55 y.o.   MRN: [id number]  HPI Patient seen for follow-up regarding chronic anxiety. He has been on Lexapro for several years. Usually takes one half tablet daily. No history of depression. His job requires lots of travel. 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 Last tetanus unknown. He has not had previous colonoscopy. Denies any change in bowel habits. He's lost some weight this past year due to his own efforts. Generally feels well. Takes no other prescription medications.  Past Medical History:  Diagnosis Date  . Anxiety state, unspecified 10/12/2010  . FOLLICULITIS 10/12/2010   No past surgical history on file.  reports that he has been smoking Cigarettes.  He has a 7.50 pack-year smoking history. He does not have any smokeless tobacco history on file. His alcohol and drug histories are not on file. family history includes Alcohol abuse in his maternal grandfather; Diabetes in his sister. No Known Allergies   Review of Systems  Constitutional: Negative for appetite change, chills, fatigue, fever and unexpected weight change.  Eyes: Negative for visual disturbance.  Respiratory: Negative for cough, chest tightness and shortness of breath.   Cardiovascular: Negative for chest pain, palpitations and leg swelling.  Neurological: Negative for dizziness, syncope, weakness, light-headedness and headaches.  Psychiatric/Behavioral: Negative for dysphoric mood and sleep disturbance.       Objective:   Physical Exam  Constitutional: He appears well-developed and well-nourished.  Neck: Neck supple. No thyromegaly present.  Cardiovascular: Normal rate and regular rhythm.   Pulmonary/Chest: Effort normal and breath sounds normal. No respiratory distress. He has no wheezes. He has no rales.  Musculoskeletal: He exhibits no edema.  Psychiatric: He has a normal mood and affect. His  behavior is normal. Judgment and thought content normal.       Assessment:     #1 chronic anxiety. Stable.  #2 health maintenance. Patient overdue for physical and health maintenance exam    Plan:     -Refill Lexapro for one year -Recommend he schedule complete physical -Information given on Cologuard.  He does not wish to schedule colonoscopy at this time. -Tetanus booster given  Kristian CoveyBruce W Korion Cuevas MD [facility]

## 2016-09-06 DIAGNOSIS — H5213 Myopia, bilateral: Secondary | ICD-10-CM | POA: Diagnosis not present

## 2016-09-08 DIAGNOSIS — M9903 Segmental and somatic dysfunction of lumbar region: Secondary | ICD-10-CM | POA: Diagnosis not present

## 2016-09-08 DIAGNOSIS — M9904 Segmental and somatic dysfunction of sacral region: Secondary | ICD-10-CM | POA: Diagnosis not present

## 2016-09-08 DIAGNOSIS — M62838 Other muscle spasm: Secondary | ICD-10-CM | POA: Diagnosis not present

## 2017-02-08 ENCOUNTER — Other Ambulatory Visit: Payer: Self-pay | Admitting: Family Medicine

## 2017-02-22 DIAGNOSIS — F4322 Adjustment disorder with anxiety: Secondary | ICD-10-CM | POA: Diagnosis not present

## 2017-02-27 DIAGNOSIS — F4322 Adjustment disorder with anxiety: Secondary | ICD-10-CM | POA: Diagnosis not present

## 2017-03-06 DIAGNOSIS — F4322 Adjustment disorder with anxiety: Secondary | ICD-10-CM | POA: Diagnosis not present

## 2017-03-13 DIAGNOSIS — F4322 Adjustment disorder with anxiety: Secondary | ICD-10-CM | POA: Diagnosis not present

## 2017-03-20 DIAGNOSIS — F4322 Adjustment disorder with anxiety: Secondary | ICD-10-CM | POA: Diagnosis not present

## 2017-04-02 DIAGNOSIS — F4322 Adjustment disorder with anxiety: Secondary | ICD-10-CM | POA: Diagnosis not present

## 2017-08-08 ENCOUNTER — Other Ambulatory Visit: Payer: Self-pay | Admitting: Family Medicine

## 2017-09-13 ENCOUNTER — Encounter: Payer: Self-pay | Admitting: Family Medicine

## 2017-11-02 ENCOUNTER — Other Ambulatory Visit: Payer: Self-pay | Admitting: Family Medicine

## 2018-03-11 ENCOUNTER — Ambulatory Visit: Payer: BLUE CROSS/BLUE SHIELD | Admitting: Family Medicine

## 2018-03-11 ENCOUNTER — Encounter: Payer: Self-pay | Admitting: Family Medicine

## 2018-03-11 VITALS — BP 100/68 | HR 77 | Temp 98.2°F | Wt 188.7 lb

## 2018-03-11 DIAGNOSIS — R202 Paresthesia of skin: Secondary | ICD-10-CM | POA: Diagnosis not present

## 2018-03-11 DIAGNOSIS — H6501 Acute serous otitis media, right ear: Secondary | ICD-10-CM | POA: Diagnosis not present

## 2018-03-11 DIAGNOSIS — F411 Generalized anxiety disorder: Secondary | ICD-10-CM

## 2018-03-11 MED ORDER — BETAMETHASONE DIPROPIONATE AUG 0.05 % EX OINT: TOPICAL_OINTMENT | Freq: Two times a day (BID) | CUTANEOUS | 0 refills | Status: DC | PRN

## 2018-03-11 MED ORDER — ESCITALOPRAM OXALATE 20 MG PO TABS: 20.0000 mg | ORAL_TABLET | Freq: Every day | ORAL | 3 refills | Status: DC

## 2018-03-11 NOTE — Patient Instructions (Signed)
Barotitis Media Barotitis media is inflammation of the middle ear. This condition occurs when an auditory tube (eustachian tube) is blocked in one or both ears. These tubes lead from the middle ear to the back of the nose (nasopharynx). This condition typically occurs when you experience changes in pressure, such as when flying or scuba diving. Untreated barotitis media may lead to damage or hearing loss (barotrauma), which may become permanent. What are the causes? This condition may be caused by changes in air pressure from:  Flying.  Scuba diving.  A nearby explosion. What increases the risk? The following factors may make you more likely to develop this condition:  Middle ear infection.  Sinus infection.  A cold.  Environmental allergies.  Small eustachian tubes.  Recent ear surgery. What are the signs or symptoms? Symptoms of this condition may include:  Ear pain.  Hearing loss. In severe cases, symptoms can include:  Dizziness and nausea (vertigo).  Temporary facial paralysis. How is this diagnosed? This condition is diagnosed based on:  A physical exam. Your health care provider may:  Use a device (otoscope) to look into your ear canal and check your eardrum.  Do a test that changes air pressure in the middle ear to check how well the eardrum moves and to see if the eustachian tube is working(tympanogram).  Your medical history. In some cases, your health care provider may have you take a hearing test. You may also be referred to someone who specializes in ear treatment (otolaryngologist, "ENT"). How is this treated? This condition may be treated with:  Medicines to relieve congestion in your nose, sinus, or upper respiratory tract (decongestants).  Techniques to equalize pressure (to "pop" your ears), such as:  Yawning.  Chewing gum.  Swallowing. In severe cases, you may need surgery to relieve your symptoms or to prevent future inflammation. Follow  these instructions at home:  Take over-the-counter and prescription medicines only as told by your health care provider.  Do not put anything into your ears to clean or unplug them. Ear drops will not help.  Keep all follow-up visits as told by your health care provider. This is important. How is this prevented? Using these strategies may help to prevent barotitis media:  Chewing gum with frequent, forceful swallowing during takeoff and landing when flying.  Holding your nose and gently blowing to pop your ears for equalizing pressure changes. This forces air into the eustachian tube.  Yawning during air pressure changes.  Using a nasal decongestant about 30-60 minutes before flying, if you have nasal congestion. Contact a health care provider if:  You have vertigo.  You have hearing loss.  Your symptoms do not get better or they get worse.  You have a fever. Get help right away if:  You have a severe headache, ear pain, and dizziness.  You have balance problems.  You cannot move or feel part of your face.  You have bloody or pus-like drainage from your ears. Summary  Barotitis media is inflammation of the middle ear.  This condition typically occurs when you experience changes in pressure, such as when flying or scuba diving.  You may be at a higher risk for this condition if you have small eustachian tubes, had recent ear surgery, or have allergies, a cold, or sinus or middle ear infection.  This condition may be treated with medicines or techniques to equalize pressure in your ears.  Strategies can be used to help prevent barotitis media. This information is   not intended to replace advice given to you by your health care provider. Make sure you discuss any questions you have with your health care provider. Document Released: 12/08/2000 Document Revised: 10/30/2016 Document Reviewed: 10/30/2016 Elsevier Interactive Patient Education  2017 [employer]  

## 2018-03-11 NOTE — Progress Notes (Signed)
Subjective:     Patient ID: [name] [name], male   DOB: 12/06/1961, 57 y.o.   MRN: [id number]  HPI Patient here for several issues as follows  History of some chronic anxiety. Stable for many years on Lexapro. Requesting refills. Denies any side effects  Three-week history of some mild right ear "fullness". He's had some mild vertigo in the past but not recently. No hearing loss. No ear pain. No drainage. flies frequently  Occasional tingling sensation left hand. Mostly index middle and ring fingers. Sparing of fifth digit. No weakness. No pain. Plays guitar frequently as a hobby. Prior history of carpal tunnel syndrome. No neck pain.  No history of colon cancer screening  Past Medical History:  Diagnosis Date  . Anxiety state, unspecified 10/12/2010  . FOLLICULITIS 10/12/2010   No past surgical history on file.  reports that he quit smoking about a year ago. His smoking use included cigarettes. He has a 7.50 pack-year smoking history. he has never used smokeless tobacco. His alcohol and drug histories are not on file. family history includes Alcohol abuse in his maternal grandfather; Diabetes in his sister. No Known Allergies   Review of Systems  Constitutional: Negative for chills and fever.  HENT: Negative for ear discharge, ear pain, hearing loss and sore throat.   Respiratory: Negative for cough.   Cardiovascular: Negative for chest pain.  Neurological: Negative for weakness.       Objective:   Physical Exam  Constitutional: He appears well-developed and well-nourished.  HENT:  Left Ear: External ear normal.  Mouth/Throat: Oropharynx is clear and moist.  No cerumen in either canal. He has a very small serous-like effusion right eardrum. No significant erythema. No bulging.  Cardiovascular: Normal rate and regular rhythm.  Pulmonary/Chest: Effort normal and breath sounds normal. No respiratory distress. He has no wheezes. He has no rales.  Neurological:  Full-strength left  upper extremity. Symmetric reflexes. Normal sensory function to touch       Assessment:     #1 chronic anxiety stable on Lexapro  #2 small right serous effusion and barotitis media  #3 tingling sensation left hand. Suspect early/mild carpal tunnel syndrome. No weakness and no significant pain      Plan:     -Refill Lexapro for one year -Consider nighttime use of wrist splint left wrist and touch base if symptoms not resolving in a few weeks -Observation regarding right ear symptoms. Explained these usually resolve on their own with time -We recommended he consider complete physical. We did discuss colon cancer screening which is never had. He is unwilling to go for colonoscopy this time but will consider cologuard  [name] CoveyBruce W Britlee Skolnik MD [facility]

## 2018-03-18 DIAGNOSIS — Z1212 Encounter for screening for malignant neoplasm of rectum: Secondary | ICD-10-CM | POA: Diagnosis not present

## 2018-03-18 DIAGNOSIS — Z1211 Encounter for screening for malignant neoplasm of colon: Secondary | ICD-10-CM | POA: Diagnosis not present

## 2018-03-18 LAB — COLOGUARD: Cologuard: NEGATIVE

## 2018-04-08 ENCOUNTER — Telehealth: Payer: Self-pay | Admitting: Family Medicine

## 2018-04-08 DIAGNOSIS — H6501 Acute serous otitis media, right ear: Secondary | ICD-10-CM

## 2018-04-08 NOTE — Telephone Encounter (Signed)
OK to set up ENT referral.

## 2018-04-08 NOTE — Telephone Encounter (Signed)
Copied from CRM [id number]. Topic: Referral - Request >> Apr 08, 2018 11:44 AM Arlyss Gandyichardson, Taren N, NT wrote: Reason for CRM: Pt requesting a referral to an ENT for his right ear still "stopped up."

## 2018-04-17 DIAGNOSIS — H6983 Other specified disorders of Eustachian tube, bilateral: Secondary | ICD-10-CM | POA: Insufficient documentation

## 2018-04-17 DIAGNOSIS — H903 Sensorineural hearing loss, bilateral: Secondary | ICD-10-CM | POA: Insufficient documentation

## 2018-04-18 ENCOUNTER — Encounter: Payer: Self-pay | Admitting: Family Medicine

## 2018-04-18 ENCOUNTER — Encounter: Payer: Self-pay | Admitting: *Deleted

## 2018-09-04 ENCOUNTER — Encounter: Payer: Self-pay | Admitting: Family Medicine

## 2018-09-04 ENCOUNTER — Ambulatory Visit: Payer: BLUE CROSS/BLUE SHIELD | Admitting: Family Medicine

## 2018-09-04 VITALS — BP 102/62 | HR 84 | Temp 97.9°F | Wt 188.9 lb

## 2018-09-04 DIAGNOSIS — F418 Other specified anxiety disorders: Secondary | ICD-10-CM

## 2018-09-04 DIAGNOSIS — G510 Bell's palsy: Secondary | ICD-10-CM

## 2018-09-04 MED ORDER — LORAZEPAM 0.5 MG PO TABS: ORAL_TABLET | ORAL | 0 refills | Status: DC

## 2018-09-04 NOTE — Progress Notes (Signed)
  Subjective:     Patient ID: [name] [name], male   DOB: 23-Jul-1961, 57 y.o.   MRN: [id number]  HPI Patient here to discuss the following issues  He was over in [place] back in June developed acute right facial weakness. Was diagnosed with Bell's palsy. Took prednisone and saw some improvement immediately. He states he is over 80% improved at this time.. No recent hearing changes. No focal weakness otherwise. Taste has essentially returned to normal.  Other issues he flies international very frequently. He has recently developed some anxiety with flying which is surprising to him since he has travelled for several years all over the world. At baseline, he takes Lexapro 20 mg daily.  Past Medical History:  Diagnosis Date  . Anxiety state, unspecified 10/12/2010  . FOLLICULITIS 10/12/2010   No past surgical history on file.  reports that he quit smoking about 17 months ago. His smoking use included cigarettes. He has a 7.50 pack-year smoking history. He has never used smokeless tobacco. His alcohol and drug histories are not on file. family history includes Alcohol abuse in his maternal grandfather; Diabetes in his sister. No Known Allergies   Review of Systems  Respiratory: Negative for shortness of breath.   Cardiovascular: Negative for chest pain.  Neurological: Negative for weakness and numbness.  Psychiatric/Behavioral: Negative for agitation and dysphoric mood. The patient is nervous/anxious.        Objective:   Physical Exam  Constitutional: He is oriented to person, place, and time. He appears well-developed and well-nourished.  Eyes: Pupils are equal, round, and reactive to light. EOM are normal.  Cardiovascular: Normal rate and regular rhythm.  Pulmonary/Chest: Effort normal and breath sounds normal.  Neurological: He is alert and oriented to person, place, and time.  Only very subtle mild weakness right side of face compared to the left otherwise normal cranial nerve exam.        Assessment:     #1 resolving Bell's palsy involving right side of face  #2 situational anxiety with flying    Plan:     -reassurance regarding his Bell's palsy. This does appear to be resolving.  -lorazepam 0.5 mg 1-2 tablets 1 hour prior to flying for any severe anxiety symptoms  Kristian Covey MD [facility] at [facility]

## 2018-09-04 NOTE — Patient Instructions (Signed)
Bell Palsy, Adult Bell palsy is a short-term inability to move muscles in part of the face. The inability to move (paralysis) results from inflammation or compression of the facial nerve, which travels along the skull and under the ear to the side of the face (7th cranial nerve). This nerve is responsible for facial movements that include blinking, closing the eyes, smiling, and frowning. What are the causes? The exact cause of this condition is not known. It may be caused by an infection from a virus, such as the chickenpox (herpes zoster), Epstein-Barr, or mumps virus. What increases the risk? You are more likely to develop this condition if:  You are pregnant.  You have diabetes.  You have had a recent infection in your nose, throat, or airways (upper respiratory infection).  You have a weakened body defense system (immune system).  You have had a facial injury, such as a fracture.  You have a family history of Bell palsy.  What are the signs or symptoms? Symptoms of this condition include:  Weakness on one side of the face.  Drooping eyelid and corner of the mouth.  Excessive tearing in one eye.  Difficulty closing the eyelid.  Dry eye.  Drooling.  Dry mouth.  Changes in taste.  Change in facial appearance.  Pain behind one ear.  Ringing in one or both ears.  Sensitivity to sound in one ear.  Facial twitching.  Headache.  Impaired speech.  Dizziness.  Difficulty eating or drinking.  Most of the time, only one side of the face is affected. Rarely, Bell palsy affects the whole face. How is this diagnosed? This condition is diagnosed based on:  Your symptoms.  Your medical history.  A physical exam.  You may also have to see health care providers who specialize in disorders of the nerves (neurologist) or diseases and conditions of the eye (ophthalmologist). You may have tests, such as:  A test to check for nerve damage (electromyogram).  Imaging  studies, such as CT or MRI scans.  Blood tests.  How is this treated? This condition affects every person differently. Sometimes symptoms go away without treatment within a couple weeks. If treatment is needed, it varies from person to person. The goal of treatment is to reduce inflammation and protect the eye from damage. Treatment for Bell palsy may include:  Medicines, such as: ? Steroids to reduce swelling and inflammation. ? Antiviral drugs. ? Pain relievers, including aspirin, acetaminophen, or ibuprofen.  Eye drops or ointment to keep your eye moist.  Eye protection, if you cannot close your eye.  Exercises or massage to regain muscle strength and function (physical therapy).  Follow these instructions at home:  Take over-the-counter and prescription medicines only as told by your health care provider.  If your eye is affected: ? Keep your eye moist with eye drops or ointment as told by your health care provider. ? Follow instructions for eye care and protection as told by your health care provider.  Do any physical therapy exercises as told by your health care provider.  Keep all follow-up visits as told by your health care provider. This is important. Contact a health care provider if:  You have a fever.  Your symptoms do not get better within 2-3 weeks, or your symptoms get worse.  Your eye is red, irritated, or painful.  You have new symptoms. Get help right away if:  You have weakness or numbness in a part of your body other than your face.    You have trouble swallowing.  You develop neck pain or stiffness.  You develop dizziness or shortness of breath. Summary  Bell palsy is a short-term inability to move muscles in part of the face. The inability to move (paralysis) results from inflammation or compression of the facial nerve.  This condition affects every person differently. Sometimes symptoms go away without treatment within a couple weeks.  If  treatment is needed, it varies from person to person. The goal of treatment is to reduce inflammation and protect the eye from damage.  Contact your health care provider if your symptoms do not get better within 2-3 weeks, or your symptoms get worse. This information is not intended to replace advice given to you by your health care provider. Make sure you discuss any questions you have with your health care provider. Document Released: 12/11/2005 Document Revised: 02/13/2017 Document Reviewed: 02/13/2017 Elsevier Interactive Patient Education  2018 Elsevier Inc.  

## 2019-01-29 ENCOUNTER — Other Ambulatory Visit: Payer: Self-pay | Admitting: Family Medicine

## 2019-01-29 NOTE — Telephone Encounter (Signed)
Copied from CRM [id number]. Topic: Quick Communication - Rx Refill/Question >> Jan 29, 2019  1:46 PM Lynne Logan D wrote: Medication: LORazepam (ATIVAN) 0.5 MG tablet  Has the patient contacted their pharmacy? Yes.   (Agent: If no, request that the patient contact the pharmacy for the refill.) (Agent: If yes, when and what did the pharmacy advise?)  Preferred Pharmacy (with phone number or street name): [id number] [place], [place] - [address] [phone] (Phone) [phone] (Fax)  Agent: Please be advised that RX refills may take up to 3 business days. We ask that you follow-up with your pharmacy.

## 2019-01-29 NOTE — Telephone Encounter (Signed)
Requesting refill: Lorazepam 0.5 MG tab  LOV 09/04/18 Last ordered 09/04/18

## 2019-01-30 NOTE — Telephone Encounter (Signed)
Last rx given on 09/04/2018 #20 with no ref

## 2019-01-31 MED ORDER — LORAZEPAM 0.5 MG PO TABS: ORAL_TABLET | ORAL | 0 refills | Status: DC

## 2019-01-31 NOTE — Telephone Encounter (Signed)
May refill once. 

## 2019-01-31 NOTE — Telephone Encounter (Signed)
Rx done. 

## 2019-10-26 ENCOUNTER — Telehealth: Payer: Self-pay | Admitting: *Deleted

## 2019-10-26 NOTE — Telephone Encounter (Signed)
Patient called after hours line  wanting a refill on Lexparo.

## 2019-10-27 MED ORDER — ESCITALOPRAM OXALATE 20 MG PO TABS: 20.0000 mg | ORAL_TABLET | Freq: Every day | ORAL | 0 refills | Status: DC

## 2019-10-27 NOTE — Telephone Encounter (Signed)
Last OV 09/04/2018  Called and spoke with pt and informed him that he has not been seen in x 1 year. Pt agreed to a follow up OV for refills. Appt was made. Pt was ok with coming in office.   Sent in #30 to his pharmacy until his visit.

## 2019-11-04 ENCOUNTER — Other Ambulatory Visit: Payer: Self-pay

## 2019-11-04 ENCOUNTER — Ambulatory Visit (INDEPENDENT_AMBULATORY_CARE_PROVIDER_SITE_OTHER): Payer: BC Managed Care – PPO | Admitting: Family Medicine

## 2019-11-04 ENCOUNTER — Encounter: Payer: Self-pay | Admitting: Family Medicine

## 2019-11-04 VITALS — BP 106/64 | HR 75 | Temp 98.5°F | Resp 16 | Ht 70.0 in | Wt 192.8 lb

## 2019-11-04 DIAGNOSIS — F411 Generalized anxiety disorder: Secondary | ICD-10-CM | POA: Diagnosis not present

## 2019-11-04 MED ORDER — ESCITALOPRAM OXALATE 20 MG PO TABS: 20.0000 mg | ORAL_TABLET | Freq: Every day | ORAL | 1 refills | Status: DC

## 2019-11-04 NOTE — Progress Notes (Signed)
  Subjective:     Patient ID: [name] [name], male   DOB: July 12, 1961, 58 y.o.   MRN: [id number]  HPI   Patient is here for medication follow-up.  Has been on Lexapro for several years for chronic anxiety symptoms.  His job has been on hold for the past year.  He does travel management for several music groups.  Has done a lot of international travel in the past.  He has been exercising regularly and generally feels well.  He states he had flu vaccine back around September.  Past Medical History:  Diagnosis Date  . Anxiety state, unspecified 10/12/2010  . FOLLICULITIS 32/95/1884   No past surgical history on file.  reports that he quit smoking about 2 years ago. His smoking use included cigarettes. He has a 7.50 pack-year smoking history. He has never used smokeless tobacco. No history on file for alcohol and drug. family history includes Alcohol abuse in his maternal grandfather; Diabetes in his sister. No Known Allergies   Review of Systems  Constitutional: Negative for appetite change, chills, fever and unexpected weight change.  Respiratory: Negative for cough.   Cardiovascular: Negative for chest pain.  Neurological: Negative for dizziness.  Hematological: Negative for adenopathy.       Objective:   Physical Exam Constitutional:      Appearance: Normal appearance.  Cardiovascular:     Rate and Rhythm: Normal rate and regular rhythm.  Pulmonary:     Effort: Pulmonary effort is normal.     Breath sounds: Normal breath sounds.  Neurological:     Mental Status: He is alert.        Assessment:     Chronic anxiety symptoms which are stable on Lexapro    Plan:     -Refill Lexapro for 1 year -Recommend he consider complete physical which he will consider  Eulas Post MD [facility] at [facility]

## 2020-05-28 ENCOUNTER — Other Ambulatory Visit: Payer: Self-pay | Admitting: Family Medicine

## 2020-06-22 DIAGNOSIS — H5213 Myopia, bilateral: Secondary | ICD-10-CM | POA: Diagnosis not present

## 2020-07-16 DIAGNOSIS — M62830 Muscle spasm of back: Secondary | ICD-10-CM | POA: Diagnosis not present

## 2020-07-16 DIAGNOSIS — M9903 Segmental and somatic dysfunction of lumbar region: Secondary | ICD-10-CM | POA: Diagnosis not present

## 2020-08-27 DIAGNOSIS — Z20822 Contact with and (suspected) exposure to covid-19: Secondary | ICD-10-CM | POA: Diagnosis not present

## 2020-11-29 ENCOUNTER — Other Ambulatory Visit: Payer: Self-pay | Admitting: Family Medicine

## 2020-11-29 NOTE — Telephone Encounter (Signed)
Patient is calling and requesting a refill for escitalopram (LEXAPRO) 20 MG tablet sent to [id number], [place]   [address], [place] [address]  Phone:  [phone] Fax:  [phone] CB is [phone]

## 2020-11-30 MED ORDER — ESCITALOPRAM OXALATE 20 MG PO TABS: 20.0000 mg | ORAL_TABLET | Freq: Every day | ORAL | 1 refills | Status: DC

## 2020-11-30 NOTE — Telephone Encounter (Signed)
Rx sent in

## 2021-02-09 ENCOUNTER — Encounter: Payer: Self-pay | Admitting: Family Medicine

## 2021-02-09 ENCOUNTER — Other Ambulatory Visit: Payer: Self-pay

## 2021-02-09 ENCOUNTER — Ambulatory Visit (INDEPENDENT_AMBULATORY_CARE_PROVIDER_SITE_OTHER): Payer: BC Managed Care – PPO | Admitting: Family Medicine

## 2021-02-09 VITALS — BP 100/60 | HR 68 | Ht 70.0 in | Wt 192.0 lb

## 2021-02-09 DIAGNOSIS — Z0000 Encounter for general adult medical examination without abnormal findings: Secondary | ICD-10-CM | POA: Diagnosis not present

## 2021-02-09 DIAGNOSIS — Z1159 Encounter for screening for other viral diseases: Secondary | ICD-10-CM | POA: Diagnosis not present

## 2021-02-09 LAB — PSA: PSA: 4.6 ng/mL — ABNORMAL HIGH (ref 0.10–4.00)

## 2021-02-09 LAB — CBC WITH DIFFERENTIAL/PLATELET
Basophils Absolute: 0.1 10*3/uL (ref 0.0–0.1)
Basophils Relative: 1.2 % (ref 0.0–3.0)
Eosinophils Absolute: 0.2 10*3/uL (ref 0.0–0.7)
Eosinophils Relative: 2.9 % (ref 0.0–5.0)
HCT: 43.8 % (ref 39.0–52.0)
Hemoglobin: 14.8 g/dL (ref 13.0–17.0)
Lymphocytes Relative: 27.8 % (ref 12.0–46.0)
Lymphs Abs: 1.6 10*3/uL (ref 0.7–4.0)
MCHC: 33.7 g/dL (ref 30.0–36.0)
MCV: 89.3 fl (ref 78.0–100.0)
Monocytes Absolute: 0.5 10*3/uL (ref 0.1–1.0)
Monocytes Relative: 9.5 % (ref 3.0–12.0)
Neutro Abs: 3.3 10*3/uL (ref 1.4–7.7)
Neutrophils Relative %: 58.6 % (ref 43.0–77.0)
Platelets: 231 10*3/uL (ref 150.0–400.0)
RBC: 4.9 Mil/uL (ref 4.22–5.81)
RDW: 12.7 % (ref 11.5–15.5)
WBC: 5.6 10*3/uL (ref 4.0–10.5)

## 2021-02-09 LAB — HEPATITIS C ANTIBODY
Hepatitis C Ab: NONREACTIVE
SIGNAL TO CUT-OFF: 0 (ref <1.00)

## 2021-02-09 LAB — BASIC METABOLIC PANEL
BUN: 17 mg/dL (ref 6–23)
CO2: 27 mEq/L (ref 19–32)
Calcium: 9.4 mg/dL (ref 8.4–10.5)
Chloride: 102 mEq/L (ref 96–112)
Creatinine, Ser: 0.92 mg/dL (ref 0.40–1.50)
GFR: 92 mL/min (ref >60.00)
Glucose, Bld: 84 mg/dL (ref 70–99)
Potassium: 4.7 mEq/L (ref 3.5–5.1)
Sodium: 138 mEq/L (ref 135–145)

## 2021-02-09 LAB — HEPATIC FUNCTION PANEL
ALT: 18 U/L (ref 0–53)
AST: 18 U/L (ref 0–37)
Albumin: 4.5 g/dL (ref 3.5–5.2)
Alkaline Phosphatase: 43 U/L (ref 39–117)
Bilirubin, Direct: 0.1 mg/dL (ref 0.0–0.3)
Total Bilirubin: 0.6 mg/dL (ref 0.2–1.2)
Total Protein: 6.5 g/dL (ref 6.0–8.3)

## 2021-02-09 LAB — LIPID PANEL
Cholesterol: 146 mg/dL (ref 0–200)
HDL: 40.2 mg/dL (ref >39.00)
LDL Cholesterol: 91 mg/dL (ref 0–99)
NonHDL: 105.43
Total CHOL/HDL Ratio: 4
Triglycerides: 73 mg/dL (ref 0.0–149.0)
VLDL: 14.6 mg/dL (ref 0.0–40.0)

## 2021-02-09 LAB — TSH: TSH: 1.23 u[IU]/mL (ref 0.35–4.50)

## 2021-02-09 MED ORDER — ALPRAZOLAM 0.5 MG PO TABS: ORAL_TABLET | ORAL | 0 refills | Status: DC

## 2021-02-09 NOTE — Progress Notes (Signed)
Established Patient Office Visit  Subjective:  Patient ID: [name] [name], male    DOB: [birth year]  Age: 60 y.o. MRN: [id number]  CC:  Chief Complaint  Patient presents with  . Annual Exam    HPI [name] [name] presents for physical exam.  Generally very healthy.  He exercises most days of the week.  Quit smoking 2018.  He states he feels as good as he ever has.  Unfortunately, his wife has had several health concerns.  He does have some stress dealing with that.  He has had some chronic insomnia issues.  He has chronic anxiety issues and takes Lexapro for that.  He has been on that for several years.  Health maintenance reviewed  -No history of hepatitis C screening -He had Cologuard back in 2019.  He is undecided whether he wishes to pursue colonoscopy at this time -Tetanus due 2027 -Flu vaccine already given -Covid vaccines complete  Social history-married.  No children.  Quit smoking 2018.  Still using some nicotine lozenges.  No alcohol use in 18 years.  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  He has a sister with what he thinks is type 2 diabetes.  Mother has history of gout but otherwise healthy.    Past Medical History:  Diagnosis Date  . Anxiety state, unspecified 10/12/2010  . FOLLICULITIS 10/12/2010    No past surgical history on file.  Family History  Problem Relation Age of Onset  . Alcohol abuse Maternal Grandfather   . Diabetes Sister   . Heart disease Father 67       CAD    Social History   Socioeconomic History  . Marital status: Married    Spouse name: Not on file  . Number of children: Not on file  . Years of education: Not on file  . Highest education level: Not on file  Occupational History  . Not on file  Tobacco Use  . Smoking status: Former Smoker    Packs/day: 0.50    Years: 15.00    Pack years: 7.50    Types: Cigarettes    Quit date:  03/11/2017    Years since quitting: 3.9  . Smokeless tobacco: Never Used  Substance and Sexual Activity  . Alcohol use: Not on file  . Drug use: Not on file  . Sexual activity: Not on file  Other Topics Concern  . Not on file  Social History Narrative  . Not on file   Social Determinants of Health   Financial Resource Strain: Not on file  Food Insecurity: Not on file  Transportation Needs: Not on file  Physical Activity: Not on file  Stress: Not on file  Social Connections: Not on file  Intimate Partner Violence: Not on file    Outpatient Medications Prior to Visit  Medication Sig Dispense Refill  . escitalopram (LEXAPRO) 20 MG tablet Take 1 tablet (20 mg total) by mouth daily. (Patient taking differently: Take 10 mg by mouth daily.) 90 tablet 1  . LORazepam (ATIVAN) 0.5 MG tablet Take one to two tablets one hour prior to flight. (Patient not taking: Reported on 11/04/2019) 20 tablet 0   No facility-administered medications prior to visit.    No Known Allergies  ROS Review of Systems  Constitutional: Negative for activity change, appetite change, fatigue and fever.  HENT: Negative for congestion, ear pain and trouble swallowing.   Eyes: Negative  for pain and visual disturbance.  Respiratory: Negative for cough, shortness of breath and wheezing.   Cardiovascular: Negative for chest pain and palpitations.  Gastrointestinal: Negative for abdominal distention, abdominal pain, blood in stool, constipation, diarrhea, nausea, rectal pain and vomiting.  Genitourinary: Negative for dysuria, hematuria and testicular pain.  Musculoskeletal: Negative for arthralgias and joint swelling.  Skin: Negative for rash.  Neurological: Negative for dizziness, syncope and headaches.  Hematological: Negative for adenopathy.  Psychiatric/Behavioral: Negative for confusion and dysphoric mood.      Objective:    Physical Exam Constitutional:      General: He is not in acute distress.     Appearance: He is well-developed and well-nourished.  HENT:     Head: Normocephalic and atraumatic.     Right Ear: External ear normal.     Left Ear: External ear normal.     Mouth/Throat:     Mouth: Oropharynx is clear and moist.  Eyes:     Extraocular Movements: EOM normal.     Conjunctiva/sclera: Conjunctivae normal.     Pupils: Pupils are equal, round, and reactive to light.  Neck:     Thyroid: No thyromegaly.  Cardiovascular:     Rate and Rhythm: Normal rate and regular rhythm.     Heart sounds: Normal heart sounds. No murmur heard.   Pulmonary:     Effort: No respiratory distress.     Breath sounds: No wheezing or rales.  Abdominal:     General: Bowel sounds are normal. There is no distension.     Palpations: Abdomen is soft. There is no mass.     Tenderness: There is no abdominal tenderness. There is no guarding or rebound.  Musculoskeletal:        General: No edema.     Cervical back: Normal range of motion and neck supple.  Lymphadenopathy:     Cervical: No cervical adenopathy.  Skin:    Findings: No rash.  Neurological:     Mental Status: He is alert and oriented to person, place, and time.     Cranial Nerves: No cranial nerve deficit.     Deep Tendon Reflexes: Reflexes normal.  Psychiatric:        Mood and Affect: Mood and affect normal.     BP 100/60   Pulse 68   Ht 5\' 10"  (1.778 m)   Wt 192 lb (87.1 kg)   SpO2 98%   BMI 27.55 kg/m  Wt Readings from Last 3 Encounters:  02/09/21 192 lb (87.1 kg)  11/04/19 192 lb 12.8 oz (87.5 kg)  09/04/18 188 lb 14.4 oz (85.7 kg)     Health Maintenance Due  Topic Date Due  . Hepatitis C Screening  Never done  . HIV Screening  Never done  . COLONOSCOPY (Pts 45-58yrs Insurance coverage will need to be confirmed)  Never done    There are no preventive care reminders to display for this patient.  Lab Results  Component Value Date   TSH 1.83 03/15/2011   Lab Results  Component Value Date   WBC 8.6  03/15/2011   HGB 14.4 03/15/2011   HCT 42.4 03/15/2011   MCV 91.7 03/15/2011   PLT 223.0 03/15/2011   Lab Results  Component Value Date   NA 137 03/15/2011   K 3.9 03/15/2011   CO2 28 03/15/2011   GLUCOSE 90 03/15/2011   BUN 20 03/15/2011   CREATININE 0.9 03/15/2011   BILITOT 0.4 03/15/2011   ALKPHOS 49 03/15/2011  AST 22 03/15/2011   ALT 21 03/15/2011   PROT 6.3 03/15/2011   ALBUMIN 4.4 03/15/2011   CALCIUM 8.9 03/15/2011   GFR 94.46 03/15/2011   Lab Results  Component Value Date   CHOL 138 03/15/2011   Lab Results  Component Value Date   HDL 24.20 (L) 03/15/2011   No results found for: [facility] Lab Results  Component Value Date   TRIG 250.0 (H) 03/15/2011   Lab Results  Component Value Date   CHOLHDL 6 03/15/2011   No results found for: HGBA1C    Assessment & Plan:   Problem List Items Addressed This Visit   None   Visit Diagnoses    Physical exam    -  Primary   Relevant Orders   Basic metabolic panel   Lipid panel   CBC with Differential/Platelet   TSH   Hepatic function panel   PSA   Hep C Antibody    We discussed the following health maintenance issues  -Recommend he consider Shingrix vaccine.  He will check on insurance coverage -Recommend either repeat Cologuard or colonoscopy and he will consider -We did discuss low-dose CT lung cancer screening.  He does meet criteria in terms of age and at least 30-pack-year history.  He will consider.  Information given. -Obtain screening labs including hepatitis C antibody -We wrote for very limited alprazolam which he takes as needed for flying  Meds ordered this encounter  Medications  . ALPRAZolam (XANAX) 0.5 MG tablet    Sig: Take one tablet by mouth about one hour prior to flying.    Dispense:  20 tablet    Refill:  0    Follow-up: No follow-ups on file.    Evelena Peat, MD

## 2021-02-09 NOTE — Patient Instructions (Signed)
Lung Cancer Screening A lung cancer screening is a test that checks for lung cancer. Lung cancer screening is done to look for lung cancer in its very early stages when you are not likely to have any symptoms and before it spreads beyond the lung, making it harder to treat. Finding cancer early improves the chances of successful treatment. It may save your life. Who should have screening? You should be screened for lung cancer if all of these apply:  You currently smoke or you have quit smoking within the past 15 years.  You are 3-27 years old. Screening may be recommended up to age 50 depending on your overall health and other factors.  You are in good general health.  You have a smoking history of 1 pack of cigarettes a day for 20 years or 2 packs a day for 10 years. Screening may also be recommended if you are at high risk for the disease. You may be at high risk if:  You have a family history of lung cancer.  You have been exposed to asbestos or radon.  You have chronic obstructive pulmonary disease (COPD). How is screening done? The recommended screening test is a low-dose computed tomography (LDCT) scan. This scan takes detailed images of the lungs. This allows a health care provider to look for abnormal cells. If you are at risk for lung cancer, it is recommended that you get screened once a year. Talk to your health care provider about the risks, benefits, and limitations of screening.   What are the benefits of screening? Screening can find lung cancer early, before symptoms start and before it has spread outside of the lungs. The chances of curing lung cancer are greater if the cancer is diagnosed early. What are the risks of screening?  The screening may show lung cancer when no cancer is present (false-positive result).  The screening may not find lung cancer when it is present.  The person gets exposed to radiation. How can I lower my risk of lung cancer? Make these  lifestyle changes to lower your risk of developing lung cancer:  Do not use any products that contain nicotine or tobacco, such as cigarettes, e-cigarettes, and chewing tobacco. If you need help quitting, ask your health care provider.  Avoid secondhand smoke.  Avoid exposure to radiation.  Avoid exposure to radon gas. Have your home checked for radon regularly.  Avoid things that cause cancer (carcinogens).  Avoid living or working in places with high air pollution. Questions to ask your health care provider  Am I eligible for lung cancer screening?  Does my health insurance cover the cost of lung cancer screening?  What happens if the lung cancer screening shows something of concern?  How soon will I have results from my lung cancer screening?  Is there anything that I need to do to prepare for my lung cancer screening?  What happens if I decide not to have lung cancer screening? Where to find more information Ask your health care provider about the risks and benefits of screening. More information and resources are available from these organizations:  American Cancer Society (ACS): www.cancer.org  American Lung Association: www.lung.org Contact a health care provider if:  You start to show symptoms of lung cancer, including: ? Coughing that will not go away. ? Making whistling sounds when you breathe (wheezing). ? Chest pain. ? Coughing up blood. ? Shortness of breath. ? Weight loss that cannot be explained. ? Constant tiredness (fatigue). ?  Hoarse voice. Summary  Lung cancer screening may find lung cancer before symptoms appear. Finding cancer early improves the chances of successful treatment. It may save your life.  The recommended screening test is a low-dose computed tomography (LDCT) scan that looks for abnormal cells in the lungs. If you are at risk for lung cancer, it is recommended that you get screened once a year.  You can make lifestyle changes to lower  your risk of lung cancer.  Ask your health care provider about the risks and benefits of screening. This information is not intended to replace advice given to you by your health care provider. Make sure you discuss any questions you have with your health care provider. Document Revised: 05/03/2020 Document Reviewed: 12/09/2019 Elsevier Patient Education  2021 Elsevier Inc.  Consider Shingrix vaccine at some point this year  Consider repeat Cologuard vs Colonoscopy.

## 2021-04-26 DIAGNOSIS — M62830 Muscle spasm of back: Secondary | ICD-10-CM | POA: Diagnosis not present

## 2021-04-26 DIAGNOSIS — M9903 Segmental and somatic dysfunction of lumbar region: Secondary | ICD-10-CM | POA: Diagnosis not present

## 2021-05-03 ENCOUNTER — Telehealth: Payer: Self-pay | Admitting: Family Medicine

## 2021-05-03 MED ORDER — ALPRAZOLAM 0.5 MG PO TABS: ORAL_TABLET | ORAL | 0 refills | Status: DC

## 2021-05-03 NOTE — Telephone Encounter (Signed)
Patient is calling and is requesting a refill for ALPRAZolam Prudy Feeler) 0.5 MG tablet to be sent to  [id number] [address], [place] - [address], [place] [address]  Phone:  [phone] Fax:  [phone]  CB is [phone]

## 2021-05-03 NOTE — Telephone Encounter (Signed)
Last filled 02/09/2021 Last OV 02/09/2021  Ok to fill? 

## 2021-06-29 ENCOUNTER — Other Ambulatory Visit: Payer: Self-pay | Admitting: Family Medicine

## 2021-06-29 MED ORDER — ALPRAZOLAM 0.5 MG PO TABS: ORAL_TABLET | ORAL | 0 refills | Status: DC

## 2021-06-29 NOTE — Telephone Encounter (Signed)
Last filled 05/03/2021 Last OV 02/09/2021 Ok to fill?

## 2021-08-26 ENCOUNTER — Other Ambulatory Visit: Payer: Self-pay | Admitting: Family Medicine

## 2021-08-30 ENCOUNTER — Encounter: Payer: Self-pay | Admitting: Family Medicine

## 2021-08-30 MED ORDER — ALPRAZOLAM 0.5 MG PO TABS: ORAL_TABLET | ORAL | 0 refills | Status: DC

## 2021-08-30 NOTE — Telephone Encounter (Signed)
Based on PDMP review it looks like last refill was back in early July.

## 2021-10-24 ENCOUNTER — Ambulatory Visit (INDEPENDENT_AMBULATORY_CARE_PROVIDER_SITE_OTHER): Payer: BC Managed Care – PPO | Admitting: *Deleted

## 2021-10-24 ENCOUNTER — Ambulatory Visit: Payer: BC Managed Care – PPO

## 2021-10-24 ENCOUNTER — Other Ambulatory Visit: Payer: Self-pay

## 2021-10-24 DIAGNOSIS — Z23 Encounter for immunization: Secondary | ICD-10-CM | POA: Diagnosis not present

## 2021-10-28 ENCOUNTER — Encounter: Payer: Self-pay | Admitting: Family Medicine

## 2021-10-31 ENCOUNTER — Other Ambulatory Visit: Payer: Self-pay

## 2021-10-31 MED ORDER — ESCITALOPRAM OXALATE 20 MG PO TABS: 20.0000 mg | ORAL_TABLET | Freq: Every day | ORAL | 1 refills | Status: DC

## 2021-11-15 ENCOUNTER — Other Ambulatory Visit: Payer: Self-pay | Admitting: Family Medicine

## 2021-11-15 MED ORDER — ALPRAZOLAM 0.5 MG PO TABS: ORAL_TABLET | ORAL | 0 refills | Status: DC

## 2021-11-15 NOTE — Telephone Encounter (Signed)
Last filled 08/30/2021 Last OV 02/09/2021  Ok to fill?

## 2021-12-27 ENCOUNTER — Other Ambulatory Visit: Payer: Self-pay

## 2021-12-27 ENCOUNTER — Ambulatory Visit (INDEPENDENT_AMBULATORY_CARE_PROVIDER_SITE_OTHER): Payer: BC Managed Care – PPO

## 2021-12-27 DIAGNOSIS — Z23 Encounter for immunization: Secondary | ICD-10-CM | POA: Diagnosis not present

## 2021-12-27 NOTE — Progress Notes (Signed)
[name] [name] is a 61 y.o. male presents to the office today for Shingrix injection #2 per physician's orders. Shingrix was administered in IM in right deltoid today. Patient tolerated injection. Patient due for annual exam appt: Yes. Date due: 02/09/21, appt made Yes  appt made Yes  [name]

## 2022-01-06 ENCOUNTER — Other Ambulatory Visit: Payer: Self-pay | Admitting: Family Medicine

## 2022-01-06 NOTE — Telephone Encounter (Signed)
Last filled 11/15/2021 Last OV 02/09/2021  Ok to fill?

## 2022-01-08 MED ORDER — ALPRAZOLAM 0.5 MG PO TABS: ORAL_TABLET | ORAL | 0 refills | Status: DC

## 2022-01-30 DIAGNOSIS — H43822 Vitreomacular adhesion, left eye: Secondary | ICD-10-CM | POA: Diagnosis not present

## 2022-02-02 ENCOUNTER — Other Ambulatory Visit: Payer: Self-pay | Admitting: Family Medicine

## 2022-02-02 ENCOUNTER — Encounter: Payer: Self-pay | Admitting: Family Medicine

## 2022-02-03 NOTE — Telephone Encounter (Signed)
Last filled 01/08/2022 Last OV 02/09/21  Ok to fill?

## 2022-02-06 ENCOUNTER — Encounter: Payer: Self-pay | Admitting: Family Medicine

## 2022-02-06 ENCOUNTER — Ambulatory Visit (INDEPENDENT_AMBULATORY_CARE_PROVIDER_SITE_OTHER): Payer: BC Managed Care – PPO | Admitting: Family Medicine

## 2022-02-06 VITALS — BP 100/62 | HR 67 | Temp 97.7°F | Ht 70.0 in | Wt 193.3 lb

## 2022-02-06 DIAGNOSIS — Z1211 Encounter for screening for malignant neoplasm of colon: Secondary | ICD-10-CM | POA: Diagnosis not present

## 2022-02-06 DIAGNOSIS — Z0000 Encounter for general adult medical examination without abnormal findings: Secondary | ICD-10-CM

## 2022-02-06 LAB — TSH: TSH: 1.65 u[IU]/mL (ref 0.35–5.50)

## 2022-02-06 LAB — CBC WITH DIFFERENTIAL/PLATELET
Basophils Absolute: 0 10*3/uL (ref 0.0–0.1)
Basophils Relative: 0.8 % (ref 0.0–3.0)
Eosinophils Absolute: 0.2 10*3/uL (ref 0.0–0.7)
Eosinophils Relative: 2.9 % (ref 0.0–5.0)
HCT: 46.1 % (ref 39.0–52.0)
Hemoglobin: 15.3 g/dL (ref 13.0–17.0)
Lymphocytes Relative: 23.4 % (ref 12.0–46.0)
Lymphs Abs: 1.6 10*3/uL (ref 0.7–4.0)
MCHC: 33.2 g/dL (ref 30.0–36.0)
MCV: 90.1 fl (ref 78.0–100.0)
Monocytes Absolute: 0.6 10*3/uL (ref 0.1–1.0)
Monocytes Relative: 9.8 % (ref 3.0–12.0)
Neutro Abs: 4.2 10*3/uL (ref 1.4–7.7)
Neutrophils Relative %: 63.1 % (ref 43.0–77.0)
Platelets: 234 10*3/uL (ref 150.0–400.0)
RBC: 5.11 Mil/uL (ref 4.22–5.81)
RDW: 12.9 % (ref 11.5–15.5)
WBC: 6.6 10*3/uL (ref 4.0–10.5)

## 2022-02-06 LAB — PSA: PSA: 4.93 ng/mL — ABNORMAL HIGH (ref 0.10–4.00)

## 2022-02-06 NOTE — Progress Notes (Signed)
Established Patient Office Visit  Subjective:  Patient ID: [name] [name], male    DOB: 11-11-1961  Age: 61 y.o. MRN: [id number]  CC:  Chief Complaint  Patient presents with   Annual Exam    HPI [name] [name] presents for physical exam.  He is dealing with stress of the fact that his mother recently passed away from multiple medical complications related to diabetes.  Also, his wife has progressive neurologic disorder and increasing care needs.    He quit smoking back in 2018.  No alcohol use.  Battles from from chronic intermittent insomnia.  No relief with melatonin.  Has taken alprazolam in the past for severe insomnia.  Health maintenance reviewed  -Did Cologuard 2019 which is negative.  Never had colonoscopy. -Declines CT lung cancer screening -Previous hepatitis C screen negative -Immunizations up-to-date  Family history-mother recently passed away complication of diabetes and what sounds like ischemic disease in her abdomen.  Father had history of coronary disease in his 28s.  He is 33 and alive.  He had a brother died age 46 of leukemia.  2 brothers who are alive and without significant medical problems.  Social history-married.  No children.  No alcohol use.  Quit smoking 2018.  Over 20-pack-year history.  Works as an TEFL teacher for music groups internationally.  Past Medical History:  Diagnosis Date   Anxiety state, unspecified 10/12/2010   FOLLICULITIS 10/12/2010    No past surgical history on file.  Family History  Problem Relation Age of Onset   Hypertension Mother    Diabetes Mother    Heart disease Father 50       CAD   Diabetes Sister    Cancer Brother 8       leukemia   Alcohol abuse Maternal Grandfather     Social History   Socioeconomic History   Marital status: Married    Spouse name: Not on file   Number of children: Not on file   Years of education: Not on file   Highest education level: Not on file  Occupational History   Not on file   Tobacco Use   Smoking status: Former    Packs/day: 0.50    Years: 15.00    Pack years: 7.50    Types: Cigarettes    Quit date: 03/11/2017    Years since quitting: 4.9   Smokeless tobacco: Never  Substance and Sexual Activity   Alcohol use: Not on file   Drug use: Not on file   Sexual activity: Not on file  Other Topics Concern   Not on file  Social History Narrative   Not on file   Social Determinants of Health   Financial Resource Strain: Not on file  Food Insecurity: Not on file  Transportation Needs: Not on file  Physical Activity: Not on file  Stress: Not on file  Social Connections: Not on file  Intimate Partner Violence: Not on file    Outpatient Medications Prior to Visit  Medication Sig Dispense Refill   ALPRAZolam (XANAX) 0.5 MG tablet Take one tablet by mouth about one hour prior to flying. 15 tablet 0   escitalopram (LEXAPRO) 20 MG tablet Take 1 tablet (20 mg total) by mouth daily. 90 tablet 1   No facility-administered medications prior to visit.    No Known Allergies  ROS Review of Systems  Constitutional:  Negative for activity change, appetite change, fatigue and fever.  HENT:  Negative for congestion, ear pain and trouble swallowing.  Eyes:  Negative for pain and visual disturbance.  Respiratory:  Negative for cough, shortness of breath and wheezing.   Cardiovascular:  Negative for chest pain and palpitations.  Gastrointestinal:  Negative for abdominal distention, abdominal pain, blood in stool, constipation, diarrhea, nausea, rectal pain and vomiting.  Genitourinary:  Negative for dysuria, hematuria and testicular pain.  Musculoskeletal:  Negative for arthralgias and joint swelling.  Skin:  Negative for rash.  Neurological:  Negative for dizziness, syncope and headaches.  Hematological:  Negative for adenopathy.  Psychiatric/Behavioral:  Negative for confusion and dysphoric mood.      Objective:    Physical Exam Constitutional:       General: He is not in acute distress.    Appearance: He is well-developed.  HENT:     Head: Normocephalic and atraumatic.     Right Ear: External ear normal.     Left Ear: External ear normal.  Eyes:     Conjunctiva/sclera: Conjunctivae normal.     Pupils: Pupils are equal, round, and reactive to light.  Neck:     Thyroid: No thyromegaly.  Cardiovascular:     Rate and Rhythm: Normal rate and regular rhythm.     Heart sounds: Normal heart sounds. No murmur heard. Pulmonary:     Effort: No respiratory distress.     Breath sounds: No wheezing or rales.  Abdominal:     General: Bowel sounds are normal. There is no distension.     Palpations: Abdomen is soft. There is no mass.     Tenderness: There is no abdominal tenderness. There is no guarding or rebound.  Musculoskeletal:     Cervical back: Normal range of motion and neck supple.     Right lower leg: No edema.     Left lower leg: No edema.  Lymphadenopathy:     Cervical: No cervical adenopathy.  Skin:    Findings: No rash.  Neurological:     Mental Status: He is alert and oriented to person, place, and time.     Cranial Nerves: No cranial nerve deficit.    BP 100/62 (BP Location: Left Arm, Patient Position: Sitting, Cuff Size: Normal)    Pulse 67    Temp 97.7 F (36.5 C) (Oral)    Ht 5\' 10"  (1.778 m)    Wt 193 lb 4.8 oz (87.7 kg)    SpO2 99%    BMI 27.74 kg/m  Wt Readings from Last 3 Encounters:  02/06/22 193 lb 4.8 oz (87.7 kg)  02/09/21 192 lb (87.1 kg)  11/04/19 192 lb 12.8 oz (87.5 kg)     Health Maintenance Due  Topic Date Due   HIV Screening  Never done   COLONOSCOPY (Pts 45-53yrs Insurance coverage will need to be confirmed)  Never done   COVID-19 Vaccine (5 - Booster for [place] series) 06/24/2021    There are no preventive care reminders to display for this patient.  Lab Results  Component Value Date   TSH 1.23 02/09/2021   Lab Results  Component Value Date   WBC 5.6 02/09/2021   HGB 14.8 02/09/2021    HCT 43.8 02/09/2021   MCV 89.3 02/09/2021   PLT 231.0 02/09/2021   Lab Results  Component Value Date   NA 138 02/09/2021   K 4.7 02/09/2021   CO2 27 02/09/2021   GLUCOSE 84 02/09/2021   BUN 17 02/09/2021   CREATININE 0.92 02/09/2021   BILITOT 0.6 02/09/2021   ALKPHOS 43 02/09/2021   AST 18 02/09/2021  ALT 18 02/09/2021   PROT 6.5 02/09/2021   ALBUMIN 4.5 02/09/2021   CALCIUM 9.4 02/09/2021   GFR 92.00 02/09/2021   Lab Results  Component Value Date   CHOL 146 02/09/2021   Lab Results  Component Value Date   HDL 40.20 02/09/2021   Lab Results  Component Value Date   LDLCALC 91 02/09/2021   Lab Results  Component Value Date   TRIG 73.0 02/09/2021   Lab Results  Component Value Date   CHOLHDL 4 02/09/2021   No results found for: HGBA1C    Assessment & Plan:   Problem List Items Addressed This Visit   None Visit Diagnoses     Physical exam    -  Primary   Relevant Orders   Basic metabolic panel   Lipid panel   CBC with Differential/Platelet   TSH   Hepatic function panel   PSA   Colon cancer screening       Relevant Orders   Ambulatory referral to Gastroenterology     -Recommend setting up screening colonoscopy.  Patient agrees  -We discussed low-dose CT lung cancer screening but he declines  -Vaccines including Shingrix and flu vaccine up-to-date  -Obtain screening labs.  No orders of the defined types were placed in this encounter.   Follow-up: No follow-ups on file.    Carolann Littler, MD

## 2022-02-07 LAB — LIPID PANEL
Cholesterol: 142 mg/dL (ref 0–200)
HDL: 41.5 mg/dL (ref >39.00)
LDL Cholesterol: 79 mg/dL (ref 0–99)
NonHDL: 100.22
Total CHOL/HDL Ratio: 3
Triglycerides: 105 mg/dL (ref 0.0–149.0)
VLDL: 21 mg/dL (ref 0.0–40.0)

## 2022-02-07 LAB — HEPATIC FUNCTION PANEL
ALT: 13 U/L (ref 0–53)
AST: 15 U/L (ref 0–37)
Albumin: 4.6 g/dL (ref 3.5–5.2)
Alkaline Phosphatase: 39 U/L (ref 39–117)
Bilirubin, Direct: 0.1 mg/dL (ref 0.0–0.3)
Total Bilirubin: 0.5 mg/dL (ref 0.2–1.2)
Total Protein: 6.5 g/dL (ref 6.0–8.3)

## 2022-02-07 LAB — BASIC METABOLIC PANEL
BUN: 12 mg/dL (ref 6–23)
CO2: 32 mEq/L (ref 19–32)
Calcium: 9.1 mg/dL (ref 8.4–10.5)
Chloride: 104 mEq/L (ref 96–112)
Creatinine, Ser: 0.92 mg/dL (ref 0.40–1.50)
GFR: 91.36 mL/min (ref >60.00)
Glucose, Bld: 90 mg/dL (ref 70–99)
Potassium: 4.2 mEq/L (ref 3.5–5.1)
Sodium: 140 mEq/L (ref 135–145)

## 2022-02-09 ENCOUNTER — Other Ambulatory Visit: Payer: Self-pay

## 2022-02-09 DIAGNOSIS — R972 Elevated prostate specific antigen [PSA]: Secondary | ICD-10-CM

## 2022-02-13 ENCOUNTER — Encounter: Payer: Self-pay | Admitting: Family Medicine

## 2022-02-13 ENCOUNTER — Other Ambulatory Visit: Payer: Self-pay | Admitting: Family Medicine

## 2022-02-13 MED ORDER — ALPRAZOLAM 0.5 MG PO TABS: ORAL_TABLET | ORAL | 0 refills | Status: DC

## 2022-02-13 NOTE — Telephone Encounter (Signed)
Okay for refill?    LOV 02/06/2022 for CPE  Last Refill   12/2021   15  QTY.

## 2022-02-23 ENCOUNTER — Encounter: Payer: Self-pay | Admitting: Family Medicine

## 2022-03-02 DIAGNOSIS — R972 Elevated prostate specific antigen [PSA]: Secondary | ICD-10-CM | POA: Diagnosis not present

## 2022-03-02 DIAGNOSIS — R3121 Asymptomatic microscopic hematuria: Secondary | ICD-10-CM | POA: Diagnosis not present

## 2022-03-05 ENCOUNTER — Other Ambulatory Visit: Payer: Self-pay | Admitting: Family Medicine

## 2022-03-06 NOTE — Telephone Encounter (Signed)
Last refill- 02/13/22--15 tabs, 0 refills ?Last OV physical- 02/06/22 ? ?No future OV scheduled. Can this patient receive a refill? ?

## 2022-03-29 DIAGNOSIS — R972 Elevated prostate specific antigen [PSA]: Secondary | ICD-10-CM | POA: Diagnosis not present

## 2022-04-05 DIAGNOSIS — R3121 Asymptomatic microscopic hematuria: Secondary | ICD-10-CM | POA: Diagnosis not present

## 2022-04-05 DIAGNOSIS — R972 Elevated prostate specific antigen [PSA]: Secondary | ICD-10-CM | POA: Diagnosis not present

## 2022-04-06 ENCOUNTER — Other Ambulatory Visit: Payer: Self-pay | Admitting: Family Medicine

## 2022-04-07 MED ORDER — ALPRAZOLAM 0.5 MG PO TABS: ORAL_TABLET | ORAL | 0 refills | Status: DC

## 2022-04-07 NOTE — Telephone Encounter (Signed)
Last refill per controlled substance database: 02/13/22 ?Last OV: 02/06/22 ?Next OV: none scheduled ?

## 2022-04-28 ENCOUNTER — Other Ambulatory Visit: Payer: Self-pay | Admitting: Family Medicine

## 2022-07-04 ENCOUNTER — Other Ambulatory Visit: Payer: Self-pay | Admitting: Family Medicine

## 2022-07-04 MED ORDER — ALPRAZOLAM 0.5 MG PO TABS: ORAL_TABLET | ORAL | 0 refills | Status: DC

## 2022-07-31 DIAGNOSIS — H43822 Vitreomacular adhesion, left eye: Secondary | ICD-10-CM | POA: Diagnosis not present

## 2022-08-23 ENCOUNTER — Other Ambulatory Visit: Payer: Self-pay | Admitting: Family Medicine

## 2022-08-23 MED ORDER — ALPRAZOLAM 0.5 MG PO TABS: ORAL_TABLET | ORAL | 0 refills | Status: DC

## 2022-08-23 NOTE — Telephone Encounter (Signed)
Last refill given on 7/11 for #15 with no ref

## 2022-08-24 ENCOUNTER — Encounter: Payer: Self-pay | Admitting: Family Medicine

## 2022-10-05 ENCOUNTER — Encounter: Payer: Self-pay | Admitting: Family Medicine

## 2022-10-10 ENCOUNTER — Ambulatory Visit: Payer: Managed Care, Other (non HMO)

## 2022-10-10 DIAGNOSIS — Z23 Encounter for immunization: Secondary | ICD-10-CM

## 2022-10-12 ENCOUNTER — Other Ambulatory Visit: Payer: Self-pay | Admitting: Urology

## 2022-10-12 DIAGNOSIS — R972 Elevated prostate specific antigen [PSA]: Secondary | ICD-10-CM

## 2022-10-18 ENCOUNTER — Other Ambulatory Visit: Payer: Self-pay | Admitting: Family Medicine

## 2022-10-18 DIAGNOSIS — N2889 Other specified disorders of kidney and ureter: Secondary | ICD-10-CM | POA: Diagnosis not present

## 2022-10-18 DIAGNOSIS — K5730 Diverticulosis of large intestine without perforation or abscess without bleeding: Secondary | ICD-10-CM | POA: Diagnosis not present

## 2022-10-19 MED ORDER — ALPRAZOLAM 0.5 MG PO TABS: ORAL_TABLET | ORAL | 0 refills | Status: DC

## 2022-11-06 ENCOUNTER — Ambulatory Visit
Admission: RE | Admit: 2022-11-06 | Discharge: 2022-11-06 | Disposition: A | Discharge status: Home / Self Care | Payer: Managed Care, Other (non HMO) | Source: Ambulatory Visit | Attending: Urology | Admitting: Urology

## 2022-11-06 DIAGNOSIS — R972 Elevated prostate specific antigen [PSA]: Secondary | ICD-10-CM

## 2022-11-06 MED ORDER — GADOPICLENOL 0.5 MMOL/ML IV SOLN
9.0000 mL | Freq: Once | INTRAVENOUS | Status: AC | PRN
  Administered 2022-11-06: 9 mL via INTRAVENOUS

## 2022-11-07 ENCOUNTER — Encounter: Payer: Self-pay | Admitting: Family Medicine

## 2022-12-14 ENCOUNTER — Other Ambulatory Visit: Payer: Self-pay | Admitting: Family Medicine

## 2022-12-15 NOTE — Telephone Encounter (Signed)
Okay for refill?  

## 2022-12-16 MED ORDER — ALPRAZOLAM 0.5 MG PO TABS: ORAL_TABLET | ORAL | 0 refills | Status: DC

## 2023-01-29 ENCOUNTER — Other Ambulatory Visit: Payer: Self-pay | Admitting: Family Medicine

## 2023-01-30 MED ORDER — ALPRAZOLAM 0.5 MG PO TABS: ORAL_TABLET | ORAL | 0 refills | Status: DC

## 2023-03-14 ENCOUNTER — Other Ambulatory Visit: Payer: Self-pay | Admitting: Family Medicine

## 2023-03-14 MED ORDER — ALPRAZOLAM 0.5 MG PO TABS: ORAL_TABLET | ORAL | 0 refills | Status: DC

## 2023-04-30 ENCOUNTER — Other Ambulatory Visit: Payer: Self-pay | Admitting: Family Medicine

## 2023-05-01 MED ORDER — ALPRAZOLAM 0.5 MG PO TABS: ORAL_TABLET | ORAL | 0 refills | Status: DC

## 2023-06-04 ENCOUNTER — Other Ambulatory Visit: Payer: Self-pay | Admitting: Family Medicine

## 2023-06-04 NOTE — Telephone Encounter (Signed)
Needs follow up.   Not seen > one year.  Kristian Covey MD [facility]

## 2023-06-18 ENCOUNTER — Ambulatory Visit: Payer: Managed Care, Other (non HMO) | Admitting: Family Medicine

## 2023-06-18 ENCOUNTER — Encounter: Payer: Self-pay | Admitting: Family Medicine

## 2023-06-18 VITALS — BP 120/80 | HR 67 | Temp 97.7°F | Ht 70.0 in | Wt 201.4 lb

## 2023-06-18 DIAGNOSIS — F411 Generalized anxiety disorder: Secondary | ICD-10-CM | POA: Diagnosis not present

## 2023-06-18 MED ORDER — BUSPIRONE HCL 15 MG PO TABS: ORAL_TABLET | ORAL | 3 refills | Status: DC

## 2023-06-18 NOTE — Patient Instructions (Signed)
Consider setting up complete physical at some point this year.   

## 2023-06-18 NOTE — Progress Notes (Signed)
Established Patient Office Visit  Subjective   Patient ID: [name] [name], male    DOB: [birth year]  Age: 62 y.o. MRN: [id number]  No chief complaint on file.   HPI   [name] [name] is here to discuss chronic anxiety issues.  His wife has multiple chronic medical problems and is very dependent on his care.  He states she is basically in the process of dying.  He has been on Lexapro 20 mg 1/2 tablet daily for years and feels like this helped his anxiety to keep down at baseline.  He still has exacerbations.  He is taken low-dose alprazolam in the past for things like flying but we have discouraged regular use.  He would like to consider other alternatives for his daily anxiety issues.  Not tried BuSpar previously.  Elevated PSA with last physical.  He underwent urologic evaluation and ended up having 2 biopsies both which proved to be negative for cancer.  He has scheduled follow-up with urology  Has gained little bit of weight and he thinks some of this may be muscle weight.  He is exercising fairly regularly.  Past Medical History:  Diagnosis Date   Anxiety state, unspecified 10/12/2010   FOLLICULITIS 10/12/2010   History reviewed. No pertinent surgical history.  reports that he quit smoking about 6 years ago. His smoking use included cigarettes. He has a 7.50 pack-year smoking history. He has never used smokeless tobacco. No history on file for alcohol use and drug use. family history includes Alcohol abuse in his maternal grandfather; Cancer (age of onset: 59) in his brother; Diabetes in his mother and sister; Heart disease (age of onset: 67) in his father; Hypertension in his mother. No Known Allergies  Review of Systems  Constitutional:  Negative for malaise/fatigue.  Eyes:  Negative for blurred vision.  Respiratory:  Negative for shortness of breath.   Cardiovascular:  Negative for chest pain.  Neurological:  Negative for dizziness, weakness and headaches.  Psychiatric/Behavioral:   Negative for depression.       Objective:     BP 120/80 (BP Location: Left Arm, Patient Position: Sitting, Cuff Size: Normal)   Pulse 67   Temp 97.7 F (36.5 C) (Oral)   Ht 5\' 10"  (1.778 m)   Wt 201 lb 6.4 oz (91.4 kg)   SpO2 98%   BMI 28.90 kg/m  BP Readings from Last 3 Encounters:  06/18/23 120/80  02/06/22 100/62  02/09/21 100/60   Wt Readings from Last 3 Encounters:  06/18/23 201 lb 6.4 oz (91.4 kg)  02/06/22 193 lb 4.8 oz (87.7 kg)  02/09/21 192 lb (87.1 kg)      Physical Exam Vitals reviewed.  Constitutional:      Appearance: Normal appearance.  Cardiovascular:     Rate and Rhythm: Normal rate and regular rhythm.  Pulmonary:     Effort: Pulmonary effort is normal.     Breath sounds: Normal breath sounds.  Musculoskeletal:     Right lower leg: No edema.     Left lower leg: No edema.  Neurological:     Mental Status: He is alert.  Psychiatric:        Mood and Affect: Mood normal.        Thought Content: Thought content normal.      No results found for any visits on 06/18/23.    The 10-year ASCVD risk score (Arnett DK, et al., 2019) is: 10.3%    Assessment & Plan:   Chronic anxiety symptoms.  Patient  has been on Lexapro for years.  Still has some breakthrough anxiety at times.  We again discussed our concerns about not escalating benzodiazepine use.  We discussed considering trial of BuSpar 15 mg 1/2 tablet twice daily for 1 week and then titrate to 15 mg twice daily.  Touch base if not seeing improvement in the next few weeks. Continue regular exercise habits and other nonpharmacologic ways of managing stress and anxiety   Evelena Peat, MD

## 2023-09-28 ENCOUNTER — Encounter: Payer: Self-pay | Admitting: Family Medicine

## 2023-09-28 ENCOUNTER — Other Ambulatory Visit: Payer: Self-pay | Admitting: Family Medicine

## 2023-09-28 DIAGNOSIS — Z1212 Encounter for screening for malignant neoplasm of rectum: Secondary | ICD-10-CM

## 2023-09-28 DIAGNOSIS — Z1211 Encounter for screening for malignant neoplasm of colon: Secondary | ICD-10-CM

## 2023-10-21 LAB — COLOGUARD: COLOGUARD: NEGATIVE

## 2023-12-31 ENCOUNTER — Other Ambulatory Visit: Payer: Self-pay | Admitting: Family Medicine

## 2024-01-01 ENCOUNTER — Other Ambulatory Visit: Payer: Self-pay | Admitting: Family Medicine

## 2024-01-01 MED ORDER — ALPRAZOLAM 0.5 MG PO TABS: ORAL_TABLET | ORAL | 0 refills | Status: DC

## 2024-01-01 NOTE — Telephone Encounter (Signed)
 Copied from CRM [id number]. Topic: Clinical - Medication Refill >> Jan 01, 2024 11:08 AM Carmell SAUNDERS wrote: Most Recent Primary Care Visit:  Provider: MICHEAL WOLM ORN  Department: [facility]  Visit Type: OFFICE VISIT  Date: 06/18/2023  Medication: escitalopram  (LEXAPRO ) 20 MG tablet  Has the patient contacted their pharmacy? No (Agent: If no, request that the patient contact the pharmacy for the refill. If patient does not wish to contact the pharmacy document the reason why and proceed with request.) (Agent: If yes, when and what did the pharmacy advise?) Decided to call doctor's office first  Is this the correct pharmacy for this prescription? Yes If no, delete pharmacy and type the correct one.  This is the patient's preferred pharmacy:  [id number] [place], [place] - [address] [place] [address] Phone: [phone] Fax: [phone]   Has the prescription been filled recently?   Is the patient out of the medication?   Has the patient been seen for an appointment in the last year OR does the patient have an upcoming appointment?   Can we respond through MyChart?   Agent: Please be advised that Rx refills may take up to 3 business days. We ask that you follow-up with your pharmacy.

## 2024-01-02 ENCOUNTER — Encounter: Payer: Self-pay | Admitting: Family Medicine

## 2024-01-02 MED ORDER — ESCITALOPRAM OXALATE 20 MG PO TABS: 20.0000 mg | ORAL_TABLET | Freq: Every day | ORAL | 1 refills | Status: DC

## 2024-01-31 ENCOUNTER — Encounter: Payer: Self-pay | Admitting: Family Medicine

## 2024-02-05 ENCOUNTER — Ambulatory Visit: Payer: Managed Care, Other (non HMO) | Admitting: Family Medicine

## 2024-02-06 ENCOUNTER — Ambulatory Visit: Payer: Managed Care, Other (non HMO) | Admitting: Family Medicine

## 2024-02-06 ENCOUNTER — Encounter: Payer: Self-pay | Admitting: Family Medicine

## 2024-02-06 VITALS — BP 120/78 | HR 77 | Temp 97.9°F | Wt 215.6 lb

## 2024-02-06 DIAGNOSIS — R972 Elevated prostate specific antigen [PSA]: Secondary | ICD-10-CM | POA: Diagnosis not present

## 2024-02-06 DIAGNOSIS — R635 Abnormal weight gain: Secondary | ICD-10-CM

## 2024-02-06 NOTE — Patient Instructions (Signed)
Consider setting up complete physical.

## 2024-02-06 NOTE — Progress Notes (Signed)
   Established Patient Office Visit  Subjective   Patient ID: [name] [name], male    DOB: [birth year]  Age: 63 y.o. MRN: [id number]  Chief Complaint  Patient presents with   Mass    HPI   [name] is seen with scaly "bump "left temporal region.  Noted at least a month ago.  No pain or itching.  No history of skin cancer.  No bleeding.  He denies any local trauma or irritation.  He is generally doing well.  He has had some weight gain recently and still exercising fairly regularly.  He knows his metabolism is changing.  Mother-in-law who is living with him and his wife just passed away 2 weeks ago.  She had a lengthy illness and was on hospice.  Past Medical History:  Diagnosis Date   Anxiety state, unspecified 10/12/2010   FOLLICULITIS 10/12/2010   History reviewed. No pertinent surgical history.  reports that he quit smoking about 6 years ago. His smoking use included cigarettes. He started smoking about 21 years ago. He has a 7.5 pack-year smoking history. He has never used smokeless tobacco. No history on file for alcohol use and drug use. family history includes Alcohol abuse in his maternal grandfather; Cancer (age of onset: 27) in his brother; Diabetes in his mother and sister; Heart disease (age of onset: 27) in his father; Hypertension in his mother. No Known Allergies  Review of Systems  Constitutional:  Negative for weight loss.      Objective:     BP 120/78 (BP Location: Left Arm, Patient Position: Sitting, Cuff Size: Normal)   Pulse 77   Temp 97.9 F (36.6 C) (Oral)   Wt 215 lb 9.6 oz (97.8 kg)   SpO2 96%   BMI 30.94 kg/m  BP Readings from Last 3 Encounters:  02/06/24 120/78  06/18/23 120/80  02/06/22 100/62   Wt Readings from Last 3 Encounters:  02/06/24 215 lb 9.6 oz (97.8 kg)  06/18/23 201 lb 6.4 oz (91.4 kg)  02/06/22 193 lb 4.8 oz (87.7 kg)      Physical Exam Vitals reviewed.  Constitutional:      Appearance: Normal appearance.  Skin:     Comments: Approximately 5 x 6 mm well-demarcated scaly lesion left temporal region.  No umbilication.  No telangiectasias.  Neurological:     Mental Status: He is alert.      No results found for any visits on 02/06/24.    The 10-year ASCVD risk score (Arnett DK, et al., 2019) is: 7.1%    Assessment & Plan:   #1 probable benign seborrheic keratosis left temporal region.  Reassurance.  Follow-up for any rapid growth, bleeding, or other concerns  #2 weight gain.  Discussed healthy strategies for weight loss.  Continue exercise habits.  Try to scale back carbs and sugars.  Recommend setting up complete physical at some point this year and will discuss further at that point.

## 2024-06-14 ENCOUNTER — Other Ambulatory Visit: Payer: Self-pay | Admitting: Family Medicine

## 2024-07-10 ENCOUNTER — Other Ambulatory Visit: Payer: Self-pay | Admitting: Family Medicine

## 2024-07-10 MED ORDER — ESCITALOPRAM OXALATE 20 MG PO TABS: 20.0000 mg | ORAL_TABLET | Freq: Every day | ORAL | 0 refills | Status: DC

## 2024-07-10 NOTE — Telephone Encounter (Signed)
 Copied from CRM [id number]. Topic: Clinical - Medication Refill >> Jul 10, 2024 10:11 AM Viola F wrote: Medication: escitalopram  (LEXAPRO ) 20 MG tablet [[id number]]  Has the patient contacted their pharmacy? Yes (Agent: If no, request that the patient contact the pharmacy for the refill. If patient does not wish to contact the pharmacy document the reason why and proceed with request.) (Agent: If yes, when and what did the pharmacy advise?)  This is the patient's preferred pharmacy:  [id number] [place] [facility] - [address] [place] [address] Phone: [phone] Fax: [phone]  Is this the correct pharmacy for this prescription? Yes If no, delete pharmacy and type the correct one.   Has the prescription been filled recently? Yes  Is the patient out of the medication? No  Has the patient been seen for an appointment in the last year OR does the patient have an upcoming appointment? Yes  Can we respond through MyChart? Yes  Agent: Please be advised that Rx refills may take up to 3 business days. We ask that you follow-up with your pharmacy.

## 2024-08-31 ENCOUNTER — Other Ambulatory Visit: Payer: Self-pay | Admitting: Family Medicine

## 2024-09-01 NOTE — Telephone Encounter (Signed)
 Needs follow up physical/repeat labs.  Refilled once.  Wolm LELON Scarlet MD [facility] at [facility]

## 2024-10-28 ENCOUNTER — Encounter: Payer: Self-pay | Admitting: Family Medicine

## 2024-12-20 ENCOUNTER — Other Ambulatory Visit: Payer: Self-pay | Admitting: Family Medicine

## 2025-01-06 ENCOUNTER — Other Ambulatory Visit: Payer: Self-pay | Admitting: Family Medicine
